# Patient Record
Sex: Male | Born: 1990 | Race: White | Hispanic: No | Marital: Single | Smoking: Never smoker
Health system: Southern US, Community
[De-identification: ages and names within clinical notes are randomized; demographics above are authoritative.]

## PROBLEM LIST (undated history)

## (undated) DIAGNOSIS — N200 Calculus of kidney: Secondary | ICD-10-CM

## (undated) HISTORY — PX: TONSILLECTOMY: SUR1361

## (undated) HISTORY — PX: HAND SURGERY: SHX662

---

## 2014-10-19 ENCOUNTER — Emergency Department
Admit: 2014-10-19 | Disposition: A | Discharge status: Home / Self Care | Payer: Self-pay | Admitting: Emergency Medicine

## 2014-11-24 ENCOUNTER — Encounter: Payer: Self-pay | Admitting: Emergency Medicine

## 2014-11-24 ENCOUNTER — Emergency Department
Admission: EM | Admit: 2014-11-24 | Discharge: 2014-11-24 | Disposition: A | Discharge status: Home / Self Care | Payer: Self-pay | Attending: Emergency Medicine | Admitting: Emergency Medicine

## 2014-11-24 DIAGNOSIS — M5431 Sciatica, right side: Secondary | ICD-10-CM

## 2014-11-24 MED ORDER — ONDANSETRON 4 MG PO TBDP
ORAL_TABLET | ORAL | Status: AC
  Filled 2014-11-24: qty 1

## 2014-11-24 MED ORDER — TRAMADOL HCL 50 MG PO TABS: 50.0000 mg | ORAL_TABLET | Freq: Four times a day (QID) | ORAL | Status: DC | PRN

## 2014-11-24 MED ORDER — DEXAMETHASONE SODIUM PHOSPHATE 10 MG/ML IJ SOLN
10.0000 mg | Freq: Once | INTRAMUSCULAR | Status: AC
  Administered 2014-11-24: 10 mg via INTRAMUSCULAR

## 2014-11-24 MED ORDER — KETOROLAC TROMETHAMINE 10 MG PO TABS: 10.0000 mg | ORAL_TABLET | Freq: Four times a day (QID) | ORAL | Status: DC | PRN

## 2014-11-24 MED ORDER — KETOROLAC TROMETHAMINE 60 MG/2ML IM SOLN: 60.0000 mg | Freq: Once | INTRAMUSCULAR | Status: DC

## 2014-11-24 MED ORDER — ONDANSETRON 4 MG PO TBDP
4.0000 mg | ORAL_TABLET | Freq: Once | ORAL | Status: AC
  Administered 2014-11-24: 4 mg via ORAL

## 2014-11-24 MED ORDER — DEXAMETHASONE SODIUM PHOSPHATE 10 MG/ML IJ SOLN
INTRAMUSCULAR | Status: AC
  Filled 2014-11-24: qty 1

## 2014-11-24 MED ORDER — KETOROLAC TROMETHAMINE 60 MG/2ML IM SOLN
INTRAMUSCULAR | Status: AC
  Filled 2014-11-24: qty 2

## 2014-11-24 MED ORDER — CYCLOBENZAPRINE HCL 10 MG PO TABS: 10.0000 mg | ORAL_TABLET | Freq: Three times a day (TID) | ORAL | Status: DC | PRN

## 2014-11-24 NOTE — ED Notes (Signed)
Pt states low back pain for "weeks". Pt states "sometimes it hurts so bad i feel like i am going to throw up." pt states has "coming and going" nausea. Pt states has pain to posterior right knee from low back. Pt denies known fever, hematuria, chills. Cms intact in all extremities per pt.

## 2014-11-24 NOTE — ED Provider Notes (Signed)
[facility] Emergency Department Provider Note  ____________________________________________  Time seen: Approximately 10:16 PM  I have reviewed the triage vital signs and the nursing notes.   HISTORY  Chief Complaint Back Pain    HPI Zachary CordsZachary A Conrad is a 24 y.o. male who presents with right-sided back pain that radiates into the right hip. He states that this is intermittent issue. He states that he is unable to follow-up outpatient with orthopedics or back specialist. He reports he is waiting on his [employer] benefits and will follow-up when possible. He reports that pain is sharp and shooting in nature and is relieved if he takes the prednisone and tramadol as prescribed. However, he forgets to take the prednisone and the pain returns.   No past medical history on file.  There are no active problems to display for this patient.   Past Surgical History  Procedure Laterality Date  . Tonsillectomy      Current Outpatient Rx  Name  Route  Sig  Dispense  Refill  . cyclobenzaprine (FLEXERIL) 10 MG tablet   Oral   Take 1 tablet (10 mg total) by mouth every 8 (eight) hours as needed for muscle spasms.   30 tablet   0   . ketorolac (TORADOL) 10 MG tablet   Oral   Take 1 tablet (10 mg total) by mouth every 6 (six) hours as needed.   20 tablet   0   . traMADol (ULTRAM) 50 MG tablet   Oral   Take 1 tablet (50 mg total) by mouth every 6 (six) hours as needed.   20 tablet   0     Allergies Keflex  History reviewed. No pertinent family history.  Social History History  Substance Use Topics  . Smoking status: Never Smoker   . Smokeless tobacco: Not on file  . Alcohol Use: Yes    Review of Systems Constitutional: No recent illness. No recent injury. Eyes: No visual changes. ENT: No sore throat. Cardiovascular: Denies chest pain or palpitations. Respiratory: Denies shortness of breath. Gastrointestinal: No abdominal pain.  Genitourinary:  Negative for dysuria. Musculoskeletal: Pain in right lower back with radiation into the right hip. Skin: Negative for rash. Neurological: Negative for headaches, focal weakness or numbness. 10-point ROS otherwise negative.  ____________________________________________   PHYSICAL EXAM:  VITAL SIGNS: ED Triage Vitals  Enc Vitals Group     BP 11/24/14 1922 146/87 mmHg     Pulse Rate 11/24/14 1922 79     Resp 11/24/14 1922 14     Temp 11/24/14 1922 98.2 F (36.8 C)     Temp Source 11/24/14 1922 Oral     SpO2 11/24/14 1922 100 %     Weight 11/24/14 1922 179 lb (81.194 kg)     Height 11/24/14 1922 5\' 9"  (1.753 m)     Head Cir --      Peak Flow --      Pain Score 11/24/14 1922 8     Pain Loc --      Pain Edu? --      Excl. in GC? --     Constitutional: Alert and oriented. Well appearing and in no acute distress. Eyes: Conjunctivae are normal. EOMI. Head: Atraumatic. Nose: No congestion/rhinnorhea. Neck: No stridor.  Respiratory: Normal respiratory effort.   Musculoskeletal: Positive straight leg raise at 30 on the right side. Neurologic:  Normal speech and language. No gross focal neurologic deficits are appreciated. Speech is normal. No gait instability. Skin:  Skin is warm, dry and intact. Atraumatic. Psychiatric: Mood and affect are normal. Speech and behavior are normal.  ____________________________________________   LABS (all labs ordered are listed, but only abnormal results are displayed)  Labs Reviewed - No data to display ____________________________________________  RADIOLOGY  Not indicated ____________________________________________   PROCEDURES  Procedure(s) performed: None   ____________________________________________   INITIAL IMPRESSION / ASSESSMENT AND PLAN / ED COURSE  Pertinent labs & imaging results that were available during my care of the patient were reviewed by me and considered in my medical decision making (see chart for  details).  I am Decadron and IM Toradol was given in the emergency department. Patient was advised to take 40 mg of prednisone pack previously prescribed tomorrow night and taper the dose until finished. He was advised to follow-up with the orthopedic or back surgeon as soon as possible. Return precautions given. ____________________________________________   FINAL CLINICAL IMPRESSION(S) / ED DIAGNOSES  Final diagnoses:  Sciatica of right side      Chinita PesterCari B Georga Stys, FNP 11/24/14 2310  Myrna Blazeravid Matthew Schaevitz, MD 11/25/14 [phone]

## 2015-03-13 ENCOUNTER — Emergency Department
Admission: EM | Admit: 2015-03-13 | Discharge: 2015-03-13 | Disposition: A | Discharge status: Home / Self Care | Payer: Self-pay | Attending: Emergency Medicine | Admitting: Emergency Medicine

## 2015-03-13 ENCOUNTER — Encounter: Payer: Self-pay | Admitting: Emergency Medicine

## 2015-03-13 DIAGNOSIS — R110 Nausea: Secondary | ICD-10-CM | POA: Insufficient documentation

## 2015-03-13 DIAGNOSIS — R109 Unspecified abdominal pain: Secondary | ICD-10-CM | POA: Insufficient documentation

## 2015-03-13 LAB — URINALYSIS COMPLETE WITH MICROSCOPIC (ARMC ONLY)
BACTERIA UA: NONE SEEN
Bilirubin Urine: NEGATIVE
Glucose, UA: NEGATIVE mg/dL
HGB URINE DIPSTICK: NEGATIVE
Ketones, ur: NEGATIVE mg/dL
Nitrite: NEGATIVE
PH: 5 (ref 5.0–8.0)
Protein, ur: NEGATIVE mg/dL
Specific Gravity, Urine: 1.023 (ref 1.005–1.030)

## 2015-03-13 LAB — BASIC METABOLIC PANEL
ANION GAP: 9 (ref 5–15)
BUN: 13 mg/dL (ref 6–20)
CHLORIDE: 99 mmol/L — AB (ref 101–111)
CO2: 25 mmol/L (ref 22–32)
Calcium: 9.3 mg/dL (ref 8.9–10.3)
Creatinine, Ser: 1.16 mg/dL (ref 0.61–1.24)
GFR calc Af Amer: >60 mL/min (ref >60)
GFR calc non Af Amer: >60 mL/min (ref >60)
GLUCOSE: 123 mg/dL — AB (ref 65–99)
POTASSIUM: 3.4 mmol/L — AB (ref 3.5–5.1)
Sodium: 133 mmol/L — ABNORMAL LOW (ref 135–145)

## 2015-03-13 LAB — CBC
HCT: 45.1 % (ref 40.0–52.0)
HEMOGLOBIN: 15.6 g/dL (ref 13.0–18.0)
MCH: 29.6 pg (ref 26.0–34.0)
MCHC: 34.6 g/dL (ref 32.0–36.0)
MCV: 85.6 fL (ref 80.0–100.0)
Platelets: 197 10*3/uL (ref 150–440)
RBC: 5.27 MIL/uL (ref 4.40–5.90)
RDW: 13.4 % (ref 11.5–14.5)
WBC: 8.5 10*3/uL (ref 3.8–10.6)

## 2015-03-13 MED ORDER — NITROFURANTOIN MONOHYD MACRO 100 MG PO CAPS: 100.0000 mg | ORAL_CAPSULE | Freq: Two times a day (BID) | ORAL | Status: AC

## 2015-03-13 MED ORDER — TRAMADOL HCL 50 MG PO TABS
50.0000 mg | ORAL_TABLET | Freq: Four times a day (QID) | ORAL | Status: DC | PRN
Start: 2015-03-13 — End: 2015-06-17

## 2015-03-13 NOTE — Discharge Instructions (Signed)
Flank Pain °Flank pain refers to pain that is located on the side of the body between the upper abdomen and the back. The pain may occur over a short period of time (acute) or may be long-term or reoccurring (chronic). It may be mild or severe. Flank pain can be caused by many things. °CAUSES  °Some of the more common causes of flank pain include: °· Muscle strains.   °· Muscle spasms.   °· A disease of your spine (vertebral disk disease).   °· A lung infection (pneumonia).   °· Fluid around your lungs (pulmonary edema).   °· A kidney infection.   °· Kidney stones.   °· A very painful skin rash caused by the chickenpox virus (shingles).   °· Gallbladder disease.   °HOME CARE INSTRUCTIONS  °Home care will depend on the cause of your pain. In general, °· Rest as directed by your caregiver. °· Drink enough fluids to keep your urine clear or pale yellow. °· Only take over-the-counter or prescription medicines as directed by your caregiver. Some medicines may help relieve the pain. °· Tell your caregiver about any changes in your pain. °· Follow up with your caregiver as directed. °SEEK IMMEDIATE MEDICAL CARE IF:  °· Your pain is not controlled with medicine.   °· You have new or worsening symptoms. °· Your pain increases.   °· You have abdominal pain.   °· You have shortness of breath.   °· You have persistent nausea or vomiting.   °· You have swelling in your abdomen.   °· You feel faint or pass out.   °· You have blood in your urine. °· You have a fever or persistent symptoms for more than 2-3 days. °· You have a fever and your symptoms suddenly get worse. °MAKE SURE YOU:  °· Understand these instructions. °· Will watch your condition. °· Will get help right away if you are not doing well or get worse. °Document Released: 08/14/2005 Document Revised: 03/17/2012 Document Reviewed: 02/05/2012 °ExitCare® Patient Information ©2015 [employer]. This information is not intended to replace advice given to you by your  health care provider. Make sure you discuss any questions you have with your health care provider. ° °

## 2015-03-13 NOTE — ED Notes (Signed)
Recently dx with kidney stone, still having right flank pain.  Skin w/d

## 2015-03-13 NOTE — ED Provider Notes (Signed)
[facility] Emergency Department Provider Note  Time seen: 3:08 PM  I have reviewed the triage vital signs and the nursing notes.   HISTORY  Chief Complaint Flank Pain    HPI Zachary Conrad is a 24 y.o. male with no past medical history who presents the emergency department with right flank pain. According to the patient for the past one month he has been experiencing right flank pain intermittently. Patient states he was diagnosed with a kidney stone. He has been seen at [facility] 3 times for the same issue. States the pain comes in waves and intermittently, and will last days without pain but then returned sharply. Describes the pain currently as 8/10, sharp pain in the right flank. Denies nausea however states he did vomit times one last night. Denies diarrhea. Denies fever. Patient states his urine was cloudy and was recently placed on antibiotics for urinary tract infection.     History reviewed. No pertinent past medical history.  There are no active problems to display for this patient.   Past Surgical History  Procedure Laterality Date  . Tonsillectomy      Current Outpatient Rx  Name  Route  Sig  Dispense  Refill  . cyclobenzaprine (FLEXERIL) 10 MG tablet   Oral   Take 1 tablet (10 mg total) by mouth every 8 (eight) hours as needed for muscle spasms.   30 tablet   0   . ketorolac (TORADOL) 10 MG tablet   Oral   Take 1 tablet (10 mg total) by mouth every 6 (six) hours as needed.   20 tablet   0   . traMADol (ULTRAM) 50 MG tablet   Oral   Take 1 tablet (50 mg total) by mouth every 6 (six) hours as needed.   20 tablet   0     Allergies Keflex  History reviewed. No pertinent family history.  Social History Social History  Substance Use Topics  . Smoking status: Never Smoker   . Smokeless tobacco: None  . Alcohol Use: Yes    Review of Systems Constitutional: Negative for fever. Cardiovascular: Negative for chest  pain. Respiratory: Negative for shortness of breath. Gastrointestinal: Positive for right flank pain and nausea. Genitourinary: States cloudy urine at times. Musculoskeletal: Positive for right flank pain Neurological: Negative for headache 10-point ROS otherwise negative.  ____________________________________________   PHYSICAL EXAM:  VITAL SIGNS: ED Triage Vitals  Enc Vitals Group     BP 03/13/15 1409 142/89 mmHg     Pulse Rate 03/13/15 1409 92     Resp 03/13/15 1409 20     Temp 03/13/15 1409 98 F (36.7 C)     Temp Source 03/13/15 1409 Oral     SpO2 03/13/15 1409 100 %     Weight 03/13/15 1409 184 lb (83.462 kg)     Height 03/13/15 1409  (1.753 m)     Head Cir --      Peak Flow --      Pain Score 03/13/15 1346 8     Pain Loc --      Pain Edu? --      Excl. in GC? --     Constitutional: Alert and oriented. Well appearing and in no distress. Eyes: Normal exam ENT   Mouth/Throat: Mucous membranes are moist. Cardiovascular: Normal rate, regular rhythm. No murmur Respiratory: Normal respiratory effort without tachypnea nor retractions. Breath sounds are clear and equal bilaterally. No wheezes/rales/rhonchi. Gastrointestinal: Soft, nontender, no reaction to  palpation. No distention. No CVA tenderness. No right upper quadrant tenderness. Musculoskeletal: Nontender with normal range of motion in all extremities. Neurologic:  Normal speech and language. No gross focal neurologic deficits  Skin:  Skin is warm, dry and intact.  Psychiatric: Mood and affect are normal. Speech and behavior are normal.   ____________________________________________    INITIAL IMPRESSION / ASSESSMENT AND PLAN / ED COURSE  Pertinent labs & imaging results that were available during my care of the patient were reviewed by me and considered in my medical decision making (see chart for details).  Patient with continued intermittent right flank pain. Patient has been seen at [facility] for this  issue 3 times. He had a negative CT abdomen and pelvis on 7/27, and again on 8/6. I do not believe the patient would benefit from additional CT imaging given normal lab work today. Patient's urinalysis does show 6-30 white blood cells in his urine, given his right flank pain we will treat with an antibiotics, I will also send a culture to confirm infection. I discussed with the patient the need to follow up with her primary care doctor, patient is agreeable. We'll discharge the patient home in a short course of Ultram and have the patient follow-up with her primary care physician.  ____________________________________________   FINAL CLINICAL IMPRESSION(S) / ED DIAGNOSES  Right flank pain   Minna Antis, MD 03/13/15 1511

## 2015-05-09 ENCOUNTER — Encounter: Payer: Self-pay | Admitting: *Deleted

## 2015-05-09 ENCOUNTER — Emergency Department
Admission: EM | Admit: 2015-05-09 | Discharge: 2015-05-09 | Disposition: A | Discharge status: Home / Self Care | Payer: Self-pay | Attending: Emergency Medicine | Admitting: Emergency Medicine

## 2015-05-09 ENCOUNTER — Emergency Department: Payer: Self-pay

## 2015-05-09 DIAGNOSIS — S60221A Contusion of right hand, initial encounter: Secondary | ICD-10-CM | POA: Insufficient documentation

## 2015-05-09 DIAGNOSIS — Y9389 Activity, other specified: Secondary | ICD-10-CM | POA: Insufficient documentation

## 2015-05-09 DIAGNOSIS — Y998 Other external cause status: Secondary | ICD-10-CM | POA: Insufficient documentation

## 2015-05-09 DIAGNOSIS — Y9289 Other specified places as the place of occurrence of the external cause: Secondary | ICD-10-CM | POA: Insufficient documentation

## 2015-05-09 DIAGNOSIS — W230XXA Caught, crushed, jammed, or pinched between moving objects, initial encounter: Secondary | ICD-10-CM | POA: Insufficient documentation

## 2015-05-09 MED ORDER — HYDROCODONE-ACETAMINOPHEN 5-325 MG PO TABS: 1.0000 | ORAL_TABLET | ORAL | Status: DC | PRN

## 2015-05-09 MED ORDER — IBUPROFEN 800 MG PO TABS: 800.0000 mg | ORAL_TABLET | Freq: Three times a day (TID) | ORAL | Status: DC | PRN

## 2015-05-09 NOTE — Discharge Instructions (Signed)
Hand Contusion  A hand contusion is a deep bruise on your hand area. Contusions are the result of an injury that caused bleeding under the skin. The contusion may turn blue, purple, or yellow. Minor injuries will give you a painless contusion, but more severe contusions may stay painful and swollen for a few weeks.  CAUSES   A contusion is usually caused by a blow, trauma, or direct force to an area of the body.  SYMPTOMS    Swelling and redness of the injured area.   Discoloration of the injured area.   Tenderness and soreness of the injured area.   Pain.  DIAGNOSIS   The diagnosis can be made by taking a history and performing a physical exam. An X-ray, CT scan, or MRI may be needed to determine if there were any associated injuries, such as broken bones (fractures).  TREATMENT   Often, the best treatment for a hand contusion is resting, elevating, icing, and applying cold compresses to the injured area. Over-the-counter medicines may also be recommended for pain control.  HOME CARE INSTRUCTIONS    Put ice on the injured area.    Put ice in a plastic bag.    Place a towel between your skin and the bag.    Leave the ice on for 15-20 minutes, 03-04 times a day.   Only take over-the-counter or prescription medicines as directed by your caregiver. Your caregiver may recommend avoiding anti-inflammatory medicines (aspirin, ibuprofen, and naproxen) for 48 hours because these medicines may increase bruising.   If told, use an elastic wrap as directed. This can help reduce swelling. You may remove the wrap for sleeping, showering, and bathing. If your fingers become numb, cold, or blue, take the wrap off and reapply it more loosely.   Elevate your hand with pillows to reduce swelling.   Avoid overusing your hand if it is painful.  SEEK IMMEDIATE MEDICAL CARE IF:    You have increased redness, swelling, or pain in your hand.   Your swelling or pain is not relieved with medicines.   You have loss of feeling in  your hand or are unable to move your fingers.   Your hand turns cold or blue.   You have pain when you move your fingers.   Your hand becomes warm to the touch.   Your contusion does not improve in 2 days.  MAKE SURE YOU:    Understand these instructions.   Will watch your condition.   Will get help right away if you are not doing well or get worse.     This information is not intended to replace advice given to you by your health care provider. Make sure you discuss any questions you have with your health care provider.     Document Released: 12/13/2001 Document Revised: 03/17/2012 Document Reviewed: 12/15/2011  Elsevier Interactive Patient Education 2016 [employer]

## 2015-05-09 NOTE — ED Provider Notes (Signed)
[facility] Emergency Department Provider Note  ____________________________________________  Time seen: Approximately 5:32 PM  I have reviewed the triage vital signs and the nursing notes.   HISTORY  Chief Complaint Hand Pain   HPI Zachary Conrad is a 24 y.o. male who presents to the emergency department for evaluation of right hand pain. He states that he was attempting to prevent his dog from jumping out of the back seat as the door of his vehicle was closing and his right hand was caught in the door. The incident occurred a few hours ago. He is complaining of pain and swelling to the right hand.   History reviewed. No pertinent past medical history.  There are no active problems to display for this patient.   Past Surgical History  Procedure Laterality Date  . Tonsillectomy      Current Outpatient Rx  Name  Route  Sig  Dispense  Refill  . cyclobenzaprine (FLEXERIL) 10 MG tablet   Oral   Take 1 tablet (10 mg total) by mouth every 8 (eight) hours as needed for muscle spasms.   30 tablet   0   . HYDROcodone-acetaminophen (NORCO/VICODIN) 5-325 MG tablet   Oral   Take 1 tablet by mouth every 4 (four) hours as needed for moderate pain.   12 tablet   0   . ibuprofen (ADVIL,MOTRIN) 800 MG tablet   Oral   Take 1 tablet (800 mg total) by mouth every 8 (eight) hours as needed.   30 tablet   0   . ketorolac (TORADOL) 10 MG tablet   Oral   Take 1 tablet (10 mg total) by mouth every 6 (six) hours as needed.   20 tablet   0   . traMADol (ULTRAM) 50 MG tablet   Oral   Take 1 tablet (50 mg total) by mouth every 6 (six) hours as needed.   15 tablet   0     Allergies Keflex  No family history on file.  Social History Social History  Substance Use Topics  . Smoking status: Never Smoker   . Smokeless tobacco: None  . Alcohol Use: Yes    Review of Systems Constitutional: No recent illness. Eyes: No visual changes. ENT: No sore  throat. Cardiovascular: Denies chest pain or palpitations. Respiratory: Denies shortness of breath. Gastrointestinal: No abdominal pain.  Genitourinary: Negative for dysuria. Musculoskeletal: Pain in right hand. Skin: Negative for rash. Neurological: Negative for headaches, focal weakness or numbness. 10-point ROS otherwise negative.  ____________________________________________   PHYSICAL EXAM:  VITAL SIGNS: ED Triage Vitals  Enc Vitals Group     BP 05/09/15 1634 153/78 mmHg     Pulse Rate 05/09/15 1634 100     Resp 05/09/15 1634 20     Temp 05/09/15 1634 98.7 F (37.1 C)     Temp Source 05/09/15 1634 Oral     SpO2 05/09/15 1634 100 %     Weight 05/09/15 1634 195 lb (88.451 kg)     Height 05/09/15 1634 5\' 10"  (1.778 m)     Head Cir --      Peak Flow --      Pain Score 05/09/15 1635 10     Pain Loc --      Pain Edu? --      Excl. in GC? --     Constitutional: Alert and oriented. Well appearing and in no acute distress. Eyes: Conjunctivae are normal. EOMI. Head: Atraumatic. Nose: No congestion/rhinnorhea. Neck: No  stridor.  Respiratory: Normal respiratory effort.   Musculoskeletal: Pain and swelling noted to the MCP and mid right hand over the third fourth and fifth metacarpals. Neurologic:  Normal speech and language. No gross focal neurologic deficits are appreciated. Speech is normal. No gait instability. Skin:  Skin is warm, dry and intact. Atraumatic. Psychiatric: Mood and affect are normal. Speech and behavior are normal.  ____________________________________________   LABS (all labs ordered are listed, but only abnormal results are displayed)  Labs Reviewed - No data to display ____________________________________________  RADIOLOGY  Right hand film negative for acute bony abnormality.  I, Kem Boroughs, personally viewed and evaluated these images (plain radiographs) as part of my medical decision making.    ____________________________________________   PROCEDURES  Procedure(s) performed: None   ____________________________________________   INITIAL IMPRESSION / ASSESSMENT AND PLAN / ED COURSE  Pertinent labs & imaging results that were available during my care of the patient were reviewed by me and considered in my medical decision making (see chart for details).  Patient was advised to follow-up with orthopedics for symptoms that are not improving over the next 7 days. He is given prescriptions for Norco and ibuprofen to be taken as needed for pain. ER return precautions were given. ____________________________________________   FINAL CLINICAL IMPRESSION(S) / ED DIAGNOSES  Final diagnoses:  Hand contusion, right, initial encounter       Chinita Pester, FNP 05/09/15 1841  Myrna Blazer, MD 05/09/15 [phone]

## 2015-05-09 NOTE — ED Notes (Signed)
Pt slammed right hand in car door, hand is swollen

## 2015-06-16 DIAGNOSIS — K0889 Other specified disorders of teeth and supporting structures: Secondary | ICD-10-CM | POA: Insufficient documentation

## 2015-06-16 NOTE — ED Notes (Signed)
Pt reports having right upper wisdom tooth extracted 2 days ago. Pt c/o bleeding and pain to site.

## 2015-06-17 ENCOUNTER — Emergency Department
Admission: EM | Admit: 2015-06-17 | Discharge: 2015-06-17 | Disposition: A | Discharge status: Home / Self Care | Payer: Self-pay | Attending: Emergency Medicine | Admitting: Emergency Medicine

## 2015-06-17 DIAGNOSIS — K0889 Other specified disorders of teeth and supporting structures: Secondary | ICD-10-CM

## 2015-06-17 MED ORDER — HYDROCODONE-ACETAMINOPHEN 5-325 MG PO TABS: 1.0000 | ORAL_TABLET | Freq: Four times a day (QID) | ORAL | Status: DC | PRN

## 2015-06-17 MED ORDER — HYDROCODONE-ACETAMINOPHEN 5-325 MG PO TABS
2.0000 | ORAL_TABLET | Freq: Once | ORAL | Status: AC
Start: 2015-06-17 — End: 2015-06-17
  Administered 2015-06-17: 2 via ORAL
  Filled 2015-06-17: qty 2

## 2015-06-17 NOTE — ED Notes (Signed)
Pt discharged, will sign for discharge instructions at 0530.

## 2015-06-17 NOTE — Discharge Instructions (Signed)
You were evaluated for pain after tooth extraction. Exam and evaluation are reassuring for no evidence of acute complication, and I suspect postoperative pain.  Follow up with your dentist on Monday or Tuesday for reevaluation.  Return to the emergency room for any worsening condition including worsening pain, confusion or altered mental status, fever, face swelling or skin rash.

## 2015-06-17 NOTE — ED Notes (Signed)
Pt informed per charge rn and nursing supervisor, pt not able to drive after narcotic administration for 4 hours. Pt declines offer for taxi, declines offer to have someone pick him up. Pt provided with ice, ice cream, tissues, drink and tv remote. Lights dimmed. Pt verbalizes understanding of driving restrictions.

## 2015-06-17 NOTE — ED Notes (Signed)
Pt sleeping. 

## 2015-06-17 NOTE — ED Notes (Signed)
Dr. Shaune PollackLord in to see pt.

## 2015-06-17 NOTE — ED Provider Notes (Signed)
[facility] Emergency Department Provider Note   ____________________________________________  Time seen:  I have reviewed the triage vital signs and the triage nursing note.  HISTORY  Chief Complaint Dental Pain   Historian Patient  HPI Zachary Conrad is a 24 y.o. male who is here for evaluation of pain since extraction of impacted right upper molar/wisdom tooth 2 days ago. He's been taking 5-10 milligrams hydrocodone every 6 hours for the past 2 days. He is down to 2 tablets left. He still having significant amount of pain. He still has caused that has a small amount of blood on it when he removes it. He is currently taking an antibiotic. No fever. Pain is moderate to severe.    No past medical history on file.  There are no active problems to display for this patient.   Past Surgical History  Procedure Laterality Date  . Tonsillectomy      Current Outpatient Rx  Name  Route  Sig  Dispense  Refill  . HYDROcodone-acetaminophen (NORCO/VICODIN) 5-325 MG tablet   Oral   Take 1 tablet by mouth every 4 (four) hours as needed for moderate pain.   12 tablet   0   . HYDROcodone-acetaminophen (NORCO/VICODIN) 5-325 MG tablet   Oral   Take 1-2 tablets by mouth every 6 (six) hours as needed for moderate pain.   10 tablet   0   . ibuprofen (ADVIL,MOTRIN) 800 MG tablet   Oral   Take 1 tablet (800 mg total) by mouth every 8 (eight) hours as needed.   30 tablet   0     Allergies Labetalol and Keflex  No family history on file.  Social History Social History  Substance Use Topics  . Smoking status: Never Smoker   . Smokeless tobacco: Not on file  . Alcohol Use: Yes    Review of Systems  Constitutional: Negative for fever. Eyes: Negative for visual changes. ENT: Negative for sore throat. Cardiovascular: Negative for chest pain. Respiratory: Negative for shortness of breath. Gastrointestinal: Negative for abdominal pain, vomiting and  diarrhea. Genitourinary: Negative for dysuria. Musculoskeletal: Negative for back pain. Skin: Negative for rash. Neurological: Negative for headache. 10 point Review of Systems otherwise negative ____________________________________________   PHYSICAL EXAM:  VITAL SIGNS: ED Triage Vitals  Enc Vitals Group     BP 06/16/15 2332 140/80 mmHg     Pulse Rate 06/16/15 2332 69     Resp 06/16/15 2332 18     Temp 06/16/15 2332 98.6 F (37 C)     Temp Source 06/16/15 2332 Oral     SpO2 06/16/15 2332 98 %     Weight 06/16/15 2332 195 lb (88.451 kg)     Height 06/16/15 2332  (1.778 m)     Head Cir --      Peak Flow --      Pain Score 06/16/15 2332 8     Pain Loc --      Pain Edu? --      Excl. in GC? --      Constitutional: Alert and oriented. Well appearing and in no distress. Eyes: Conjunctivae are normal. PERRL. Normal extraocular movements. ENT   Head: Normocephalic.  Mild swelling at the right maxilla. No skin rash.   Nose: No congestion/rhinnorhea.   Mouth/Throat: Mucous membranes are moist. Swelling of the gum at the right posterior most molar at the site of the recent surgery. Small white fleck seen at the Center of the wound,  consistent with likely stitch versus bone fragment.   Neck: No stridor. Cardiovascular/Chest: Normal rate, regular rhythm.  No murmurs, rubs, or gallops. Respiratory: Normal respiratory effort without tachypnea nor retractions. Breath sounds are clear and equal bilaterally. No wheezes/rales/rhonchi. Gastrointestinal: Soft. No distention, no guarding, no rebound. Nontender   Genitourinary/rectal:Deferred Musculoskeletal: Nontender with normal range of motion in all extremities. No joint effusions.  No lower extremity tenderness.  No edema. Neurologic:  Normal speech and language. No gross or focal neurologic deficits are appreciated. Skin:  Skin is warm, dry and intact. No rash noted. Psychiatric: Mood and affect are normal. Speech and  behavior are normal. Patient exhibits appropriate insight and judgment.  ____________________________________________   EKG I, Governor Rooksebecca Ebany Bowermaster, MD, the attending physician have personally viewed and interpreted all ECGs.   EKG performed ____________________________________________  LABS (pertinent positives/negatives)  None  ____________________________________________  RADIOLOGY All Xrays were viewed by me. Imaging interpreted by Radiologist.  None __________________________________________  PROCEDURES  Procedure(s) performed: None  Critical Care performed: None  ____________________________________________   ED COURSE / ASSESSMENT AND PLAN  CONSULTATIONS: None  Pertinent labs & imaging results that were available during my care of the patient were reviewed by me and considered in my medical decision making (see chart for details).   Patient impacted wisdom tooth removed 2 days ago and still having postoperative pain, but I feel like his course is consistent with postoperative pain rather than new/acute complication. No fevers, no skin rash, no trouble swallowing. I am going to prescribe 10 additional hydrocodone tablets. Patient instructed to follow with his dentist on Monday.  Patient / Family / Caregiver informed of clinical course, medical decision-making process, and agree with plan.   I discussed return precautions, follow-up instructions, and discharged instructions with patient and/or family.  ___________________________________________   FINAL CLINICAL IMPRESSION(S) / ED DIAGNOSES   Final diagnoses:  Pain, dental       Governor Rooksebecca Kareem Aul, MD 06/17/15 [phone]

## 2015-11-16 ENCOUNTER — Emergency Department
Admission: EM | Admit: 2015-11-16 | Discharge: 2015-11-17 | Disposition: A | Discharge status: Home / Self Care | Payer: Self-pay | Attending: Emergency Medicine | Admitting: Emergency Medicine

## 2015-11-16 ENCOUNTER — Encounter: Payer: Self-pay | Admitting: Emergency Medicine

## 2015-11-16 DIAGNOSIS — R319 Hematuria, unspecified: Secondary | ICD-10-CM | POA: Insufficient documentation

## 2015-11-16 DIAGNOSIS — A749 Chlamydial infection, unspecified: Secondary | ICD-10-CM | POA: Insufficient documentation

## 2015-11-16 DIAGNOSIS — R109 Unspecified abdominal pain: Secondary | ICD-10-CM

## 2015-11-16 HISTORY — DX: Calculus of kidney: N20.0

## 2015-11-16 LAB — CBC
HCT: 42.3 % (ref 40.0–52.0)
HEMOGLOBIN: 14.3 g/dL (ref 13.0–18.0)
MCH: 29 pg (ref 26.0–34.0)
MCHC: 33.9 g/dL (ref 32.0–36.0)
MCV: 85.5 fL (ref 80.0–100.0)
Platelets: 270 10*3/uL (ref 150–440)
RBC: 4.95 MIL/uL (ref 4.40–5.90)
RDW: 13.5 % (ref 11.5–14.5)
WBC: 9.1 10*3/uL (ref 3.8–10.6)

## 2015-11-16 LAB — BASIC METABOLIC PANEL
Anion gap: 5 (ref 5–15)
BUN: 6 mg/dL (ref 6–20)
CHLORIDE: 108 mmol/L (ref 101–111)
CO2: 29 mmol/L (ref 22–32)
Calcium: 9.5 mg/dL (ref 8.9–10.3)
Creatinine, Ser: 0.95 mg/dL (ref 0.61–1.24)
GFR calc Af Amer: >60 mL/min (ref >60)
GFR calc non Af Amer: >60 mL/min (ref >60)
Glucose, Bld: 101 mg/dL — ABNORMAL HIGH (ref 65–99)
Potassium: 3.6 mmol/L (ref 3.5–5.1)
SODIUM: 142 mmol/L (ref 135–145)

## 2015-11-16 LAB — URINALYSIS COMPLETE WITH MICROSCOPIC (ARMC ONLY)
Bilirubin Urine: NEGATIVE
Glucose, UA: NEGATIVE mg/dL
KETONES UR: NEGATIVE mg/dL
Leukocytes, UA: NEGATIVE
Nitrite: NEGATIVE
PH: 7 (ref 5.0–8.0)
Protein, ur: NEGATIVE mg/dL
Specific Gravity, Urine: 1.013 (ref 1.005–1.030)
Squamous Epithelial / LPF: NONE SEEN

## 2015-11-16 NOTE — ED Notes (Signed)
Pt states over one week of "waking up in the night crying because my kidneys hurt" pt states 'It's not 24/7 pain, but when it comes it's unbearable." pt denies known fever, denies dysuria. Pt states would also like to be checked for chlamydia. Pt appears in no acute distress in triage.

## 2015-11-16 NOTE — ED Notes (Signed)
Pt given urine cup and told that another sample was needed.  Pt given water to drink

## 2015-11-17 ENCOUNTER — Emergency Department: Payer: Self-pay

## 2015-11-17 LAB — CHLAMYDIA/NGC RT PCR (ARMC ONLY)
Chlamydia Tr: DETECTED — AB
N GONORRHOEAE: NOT DETECTED

## 2015-11-17 MED ORDER — PROMETHAZINE HCL 25 MG PO TABS: 25.0000 mg | ORAL_TABLET | Freq: Four times a day (QID) | ORAL | Status: DC | PRN

## 2015-11-17 MED ORDER — KETOROLAC TROMETHAMINE 30 MG/ML IJ SOLN
30.0000 mg | Freq: Once | INTRAMUSCULAR | Status: AC
  Administered 2015-11-17: 30 mg via INTRAMUSCULAR
  Filled 2015-11-17: qty 1

## 2015-11-17 MED ORDER — AZITHROMYCIN 500 MG PO TABS
2000.0000 mg | ORAL_TABLET | Freq: Once | ORAL | Status: AC
  Administered 2015-11-17: 2000 mg via ORAL
  Filled 2015-11-17: qty 4

## 2015-11-17 MED ORDER — METRONIDAZOLE 500 MG PO TABS: 500.0000 mg | ORAL_TABLET | Freq: Three times a day (TID) | ORAL | Status: DC

## 2015-11-17 MED ORDER — NAPROXEN 500 MG PO TABS: 500.0000 mg | ORAL_TABLET | Freq: Two times a day (BID) | ORAL | Status: DC

## 2015-11-17 MED ORDER — ONDANSETRON 8 MG PO TBDP
8.0000 mg | ORAL_TABLET | Freq: Once | ORAL | Status: AC
  Administered 2015-11-17: 8 mg via ORAL
  Filled 2015-11-17: qty 1

## 2015-11-17 MED ORDER — DOXYCYCLINE HYCLATE 100 MG PO CAPS: 100.0000 mg | ORAL_CAPSULE | Freq: Two times a day (BID) | ORAL | Status: DC

## 2015-11-17 NOTE — ED Provider Notes (Signed)
[facility] Emergency Department Provider Note  ____________________________________________  Time seen: 12:00 AM  I have reviewed the triage vital signs and the nursing notes.   HISTORY  Chief Complaint Flank Pain    HPI Zachary Conrad is a 25 y.o. male comes to the ED complaining of one week of right flank pain that is intermittent. Happens once or twice a day. No dysuria frequency urgency hematuria. He was recently treated for Chlamydia with what he thinks was doxycycline. Denies any nausea or vomiting, no chest pain shortness of breath or fevers. Pain is severe when present, feels aching and sharp. Nonradiating.     Past Medical History  Diagnosis Date  . Kidney stones      There are no active problems to display for this patient.    Past Surgical History  Procedure Laterality Date  . Tonsillectomy       Current Outpatient Rx  Name  Route  Sig  Dispense  Refill  . diphenhydrAMINE (BENADRYL) 50 MG capsule   Oral   Take 50 mg by mouth every 6 (six) hours as needed.         . doxycycline (VIBRAMYCIN) 100 MG capsule   Oral   Take 1 capsule (100 mg total) by mouth 2 (two) times daily.   28 capsule   0   . metroNIDAZOLE (FLAGYL) 500 MG tablet   Oral   Take 1 tablet (500 mg total) by mouth 3 (three) times daily.   30 tablet   0   . naproxen (NAPROSYN) 500 MG tablet   Oral   Take 1 tablet (500 mg total) by mouth 2 (two) times daily with a meal.   20 tablet   0   . promethazine (PHENERGAN) 25 MG tablet   Oral   Take 1 tablet (25 mg total) by mouth every 6 (six) hours as needed for nausea or vomiting.   15 tablet   0      Allergies Labetalol; Ciprofloxacin; and Keflex   No family history on file.  Social History Social History  Substance Use Topics  . Smoking status: Never Smoker   . Smokeless tobacco: Never Used  . Alcohol Use: Yes    Review of Systems  Constitutional:   No fever or chills.  Eyes:   No  vision changes.  ENT:   No sore throat. No rhinorrhea. Cardiovascular:   No chest pain. Respiratory:   No dyspnea or cough. Gastrointestinal:   Negative for abdominal pain, vomiting and diarrhea.  Genitourinary:   Negative for dysuria or difficulty urinating. Musculoskeletal:   Right flank pain as above Neurological:   Negative for headaches 10-point ROS otherwise negative.  ____________________________________________   PHYSICAL EXAM:  VITAL SIGNS: ED Triage Vitals  Enc Vitals Group     BP 11/16/15 2012 146/79 mmHg     Pulse Rate 11/16/15 2012 80     Resp 11/16/15 2012 14     Temp 11/16/15 2012 98.9 F (37.2 C)     Temp Source 11/16/15 2012 Oral     SpO2 11/16/15 2012 100 %     Weight 11/16/15 2012 173 lb (78.472 kg)     Height 11/16/15 2012 5\' 10"  (1.778 m)     Head Cir --      Peak Flow --      Pain Score 11/16/15 2012 5     Pain Loc --      Pain Edu? --      Excl.  in GC? --     Vital signs reviewed, nursing assessments reviewed.   Constitutional:   Alert and oriented. Well appearing and in no distress. Eyes:   No scleral icterus. No conjunctival pallor. PERRL. EOMI.  No nystagmus. ENT   Head:   Normocephalic and atraumatic.   Nose:   No congestion/rhinnorhea. No septal hematoma   Mouth/Throat:   MMM, no pharyngeal erythema. No peritonsillar mass.    Neck:   No stridor. No SubQ emphysema. No meningismus. Hematological/Lymphatic/Immunilogical:   No cervical lymphadenopathy. Cardiovascular:   RRR. Symmetric bilateral radial and DP pulses.  No murmurs.  Respiratory:   Normal respiratory effort without tachypnea nor retractions. Breath sounds are clear and equal bilaterally. No wheezes/rales/rhonchi. Gastrointestinal:   Soft With mild right lower quadrant tenderness. Non distended. There is no CVA tenderness.  No rebound, rigidity, or guarding. Genitourinary:   Normal external genitalia, no scrotal tenderness or swelling. No testicular tenderness. No  inguinal lymphadenopathy. No lesions or penile discharge. Musculoskeletal:   Nontender with normal range of motion in all extremities. No joint effusions.  No lower extremity tenderness.  No edema. Neurologic:   Normal speech and language.  CN 2-10 normal. Motor grossly intact. No gross focal neurologic deficits are appreciated.  Skin:    Skin is warm, dry and intact. No rash noted.  No petechiae, purpura, or bullae.  ____________________________________________    LABS (pertinent positives/negatives) (all labs ordered are listed, but only abnormal results are displayed) Labs Reviewed  CHLAMYDIA/NGC RT PCR ([facility] ONLY) - Abnormal; Notable for the following:    Chlamydia Tr DETECTED (*)    All other components within normal limits  URINALYSIS COMPLETEWITH MICROSCOPIC ([facility] ONLY) - Abnormal; Notable for the following:    Color, Urine YELLOW (*)    APPearance CLEAR (*)    Hgb urine dipstick 3+ (*)    Bacteria, UA RARE (*)    All other components within normal limits  BASIC METABOLIC PANEL - Abnormal; Notable for the following:    Glucose, Bld 101 (*)    All other components within normal limits  CBC   ____________________________________________   EKG    ____________________________________________    RADIOLOGY  CT abdomen and pelvis negative  ____________________________________________   PROCEDURES   ____________________________________________   INITIAL IMPRESSION / ASSESSMENT AND PLAN / ED COURSE  Pertinent labs & imaging results that were available during my care of the patient were reviewed by me and considered in my medical decision making (see chart for details).  Patient well appearing no acute distress. Presents with flank pain and found to have hematuria as well. Labs unremarkable otherwise. Repeat Chlamydia testing is positive, we'll give 2 g azithromycin, repeat course of doxycycline as well as Flagyl. Plan for discharge home and follow-up  with primary care.Considering the patient's symptoms, medical history, and physical examination today, I have low suspicion for cholecystitis or biliary pathology, pancreatitis, perforation or bowel obstruction, hernia, intra-abdominal abscess, AAA or dissection, volvulus or intussusception, mesenteric ischemia, or appendicitis.       ____________________________________________   FINAL CLINICAL IMPRESSION(S) / ED DIAGNOSES  Final diagnoses:  Hematuria  Right flank pain  Chlamydia infection     Portions of this note were generated with dragon dictation software. Dictation errors may occur despite best attempts at proofreading.   Sharman Cheek, MD 11/17/15 [phone]

## 2015-11-17 NOTE — Discharge Instructions (Signed)
Abdominal Pain, Adult °Many things can cause abdominal pain. Usually, abdominal pain is not caused by a disease and will improve without treatment. It can often be observed and treated at home. Your health care provider will do a physical exam and possibly order blood tests and X-rays to help determine the seriousness of your pain. However, in many cases, more time must pass before a clear cause of the pain can be found. Before that point, your health care provider may not know if you need more testing or further treatment. °HOME CARE INSTRUCTIONS °Monitor your abdominal pain for any changes. The following actions may help to alleviate any discomfort you are experiencing: °· Only take over-the-counter or prescription medicines as directed by your health care provider. °· Do not take laxatives unless directed to do so by your health care provider. °· Try a clear liquid diet (broth, tea, or water) as directed by your health care provider. Slowly move to a bland diet as tolerated. °SEEK MEDICAL CARE IF: °· You have unexplained abdominal pain. °· You have abdominal pain associated with nausea or diarrhea. °· You have pain when you urinate or have a bowel movement. °· You experience abdominal pain that wakes you in the night. °· You have abdominal pain that is worsened or improved by eating food. °· You have abdominal pain that is worsened with eating fatty foods. °· You have a fever. °SEEK IMMEDIATE MEDICAL CARE IF: °· Your pain does not go away within 2 hours. °· You keep throwing up (vomiting). °· Your pain is felt only in portions of the abdomen, such as the right side or the left lower portion of the abdomen. °· You pass bloody or black tarry stools. °MAKE SURE YOU: °· Understand these instructions. °· Will watch your condition. °· Will get help right away if you are not doing well or get worse. °  °This information is not intended to replace advice given to you by your health care provider. Make sure you discuss  any questions you have with your health care provider. °  °Document Released: 04/02/2005 Document Revised: 03/14/2015 Document Reviewed: 03/02/2013 °Elsevier Interactive Patient Education ©2016 [employer] ° °Flank Pain °Flank pain refers to pain that is located on the side of the body between the upper abdomen and the back. The pain may occur over a short period of time (acute) or may be long-term or reoccurring (chronic). It may be mild or severe. Flank pain can be caused by many things. °CAUSES  °Some of the more common causes of flank pain include: °· Muscle strains.   °· Muscle spasms.   °· A disease of your spine (vertebral disk disease).   °· A lung infection (pneumonia).   °· Fluid around your lungs (pulmonary edema).   °· A kidney infection.   °· Kidney stones.   °· A very painful skin rash caused by the chickenpox virus (shingles).   °· Gallbladder disease.   °HOME CARE INSTRUCTIONS  °Home care will depend on the cause of your pain. In general, °· Rest as directed by your caregiver. °· Drink enough fluids to keep your urine clear or pale yellow. °· Only take over-the-counter or prescription medicines as directed by your caregiver. Some medicines may help relieve the pain. °· Tell your caregiver about any changes in your pain. °· Follow up with your caregiver as directed. °SEEK IMMEDIATE MEDICAL CARE IF:  °· Your pain is not controlled with medicine.   °· You have new or worsening symptoms. °· Your pain increases.   °· You have abdominal   pain.   You have shortness of breath.   You have persistent nausea or vomiting.   You have swelling in your abdomen.   You feel faint or pass out.   You have blood in your urine.  You have a fever or persistent symptoms for more than 2-3 days.  You have a fever and your symptoms suddenly get worse. MAKE SURE YOU:   Understand these instructions.  Will watch your condition.  Will get help right away if you are not doing well or get worse.   This  information is not intended to replace advice given to you by your health care provider. Make sure you discuss any questions you have with your health care provider.   Document Released: 08/14/2005 Document Revised: 03/17/2012 Document Reviewed: 02/05/2012 Elsevier Interactive Patient Education 2016 [employer]  Hematuria, Adult Hematuria is blood in your urine. It can be caused by a bladder infection, kidney infection, prostate infection, kidney stone, or cancer of your urinary tract. Infections can usually be treated with medicine, and a kidney stone usually will pass through your urine. If neither of these is the cause of your hematuria, further workup to find out the reason may be needed. It is very important that you tell your health care provider about any blood you see in your urine, even if the blood stops without treatment or happens without causing pain. Blood in your urine that happens and then stops and then happens again can be a symptom of a very serious condition. Also, pain is not a symptom in the initial stages of many urinary cancers. HOME CARE INSTRUCTIONS   Drink lots of fluid, 3-4 quarts a day. If you have been diagnosed with an infection, cranberry juice is especially recommended, in addition to large amounts of water.  Avoid caffeine, tea, and carbonated beverages because they tend to irritate the bladder.  Avoid alcohol because it may irritate the prostate.  Take all medicines as directed by your health care provider.  If you were prescribed an antibiotic medicine, finish it all even if you start to feel better.  If you have been diagnosed with a kidney stone, follow your health care provider's instructions regarding straining your urine to catch the stone.  Empty your bladder often. Avoid holding urine for long periods of time.  After a bowel movement, women should cleanse front to back. Use each tissue only once.  Empty your bladder before and after sexual  intercourse if you are a male. SEEK MEDICAL CARE IF:  You develop back pain.  You have a fever.  You have a feeling of sickness in your stomach (nausea) or vomiting.  Your symptoms are not better in 3 days. Return sooner if you are getting worse. SEEK IMMEDIATE MEDICAL CARE IF:   You develop severe vomiting and are unable to keep the medicine down.  You develop severe back or abdominal pain despite taking your medicines.  You begin passing a large amount of blood or clots in your urine.  You feel extremely weak or faint, or you pass out. MAKE SURE YOU:   Understand these instructions.  Will watch your condition.  Will get help right away if you are not doing well or get worse.   This information is not intended to replace advice given to you by your health care provider. Make sure you discuss any questions you have with your health care provider.   Document Released: 06/23/2005 Document Revised: 07/14/2014 Document Reviewed: 02/21/2013 Elsevier Interactive Patient  Education ©2016 [employer] ° °

## 2015-11-17 NOTE — ED Notes (Signed)
Patient transported to CT 

## 2016-05-15 ENCOUNTER — Emergency Department: Payer: Self-pay

## 2016-05-15 ENCOUNTER — Emergency Department
Admission: EM | Admit: 2016-05-15 | Discharge: 2016-05-15 | Disposition: A | Discharge status: Home / Self Care | Payer: Self-pay | Attending: Student in an Organized Health Care Education/Training Program | Admitting: Student in an Organized Health Care Education/Training Program

## 2016-05-15 ENCOUNTER — Encounter: Payer: Self-pay | Admitting: Emergency Medicine

## 2016-05-15 DIAGNOSIS — Z79899 Other long term (current) drug therapy: Secondary | ICD-10-CM | POA: Insufficient documentation

## 2016-05-15 DIAGNOSIS — Z791 Long term (current) use of non-steroidal anti-inflammatories (NSAID): Secondary | ICD-10-CM | POA: Insufficient documentation

## 2016-05-15 DIAGNOSIS — M545 Low back pain: Secondary | ICD-10-CM | POA: Insufficient documentation

## 2016-05-15 DIAGNOSIS — M5441 Lumbago with sciatica, right side: Secondary | ICD-10-CM

## 2016-05-15 MED ORDER — PREDNISONE 10 MG (21) PO TBPK: 10.0000 mg | ORAL_TABLET | Freq: Every day | ORAL | 0 refills | Status: DC

## 2016-05-15 NOTE — ED Provider Notes (Signed)
[facility] Emergency Department Provider Note  ____________________________________________   First MD Initiated Contact with Patient 05/15/16 2102     (approximate)  I have reviewed the triage vital signs and the nursing notes.   HISTORY  Chief Complaint Back Pain    HPI Zachary Conrad is a 25 y.o. male presenting with low back pain that started 2 weeks ago. Patient currently rates his back pain at 6 out of 10 and describes it as "numb". He states that back pain radiates down the posterolateral aspect of his right leg to the knee. Patient denies pain in the groin or bowl/bladder incontinence. Denies hematuria and dysuria. He states that he exercises daily. He denies weakness. Patient states that pain is worsened with walking and with activities that involve extension. He has taken ibuprofen daily, which has not improved his symptoms. Patient denies falls or other incidences of trauma.   Past Medical History:  Diagnosis Date  . Kidney stones     There are no active problems to display for this patient.   Past Surgical History:  Procedure Laterality Date  . TONSILLECTOMY      Prior to Admission medications   Medication Sig Start Date End Date Taking? Authorizing Provider  diphenhydrAMINE (BENADRYL) 50 MG capsule Take 50 mg by mouth every 6 (six) hours as needed.    Historical Provider, MD  doxycycline (VIBRAMYCIN) 100 MG capsule Take 1 capsule (100 mg total) by mouth 2 (two) times daily. 11/17/15   Sharman CheekPhillip Stafford, MD  metroNIDAZOLE (FLAGYL) 500 MG tablet Take 1 tablet (500 mg total) by mouth 3 (three) times daily. 11/17/15   Sharman CheekPhillip Stafford, MD  naproxen (NAPROSYN) 500 MG tablet Take 1 tablet (500 mg total) by mouth 2 (two) times daily with a meal. 11/17/15   Sharman CheekPhillip Stafford, MD  predniSONE (STERAPRED UNI-PAK 21 TAB) 10 MG (21) TBPK tablet Take 1 tablet (10 mg total) by mouth daily. 05/15/16   Orvil FeilJaclyn M Alaya Iverson, PA-C  promethazine (PHENERGAN) 25 MG  tablet Take 1 tablet (25 mg total) by mouth every 6 (six) hours as needed for nausea or vomiting. 11/17/15   Sharman CheekPhillip Stafford, MD    Allergies Labetalol; Bactrim [sulfamethoxazole-trimethoprim]; Ciprofloxacin; and Keflex [cephalexin]  No family history on file.  Social History Social History  Substance Use Topics  . Smoking status: Never Smoker  . Smokeless tobacco: Never Used  . Alcohol use Yes    Review of Systems Constitutional: No fever/chills Eyes: No visual changes. ENT: No sore throat. Cardiovascular: Denies chest pain. Respiratory: Denies shortness of breath. Gastrointestinal: No abdominal pain.  No nausea, no vomiting.  No diarrhea.  No constipation.  Genitourinary: Negative for dysuria. Musculoskeletal: Has back pain. Skin: Negative for rash. Neurological: Negative for headaches, focal weakness or numbness.  10-point ROS otherwise negative.  ____________________________________________   PHYSICAL EXAM:  VITAL SIGNS: ED Triage Vitals  Enc Vitals Group     BP 05/15/16 2015 136/82     Pulse Rate 05/15/16 2015 89     Resp 05/15/16 2015 18     Temp 05/15/16 2015 98.9 F (37.2 C)     Temp Source 05/15/16 2015 Oral     SpO2 05/15/16 2015 98 %     Weight 05/15/16 2013 176 lb (79.8 kg)     Height 05/15/16 2013 5\' 9"  (1.753 m)     Head Circumference --      Peak Flow --      Pain Score 05/15/16 2013 8  Pain Loc --      Pain Edu? --      Excl. in GC? --     Constitutional: Alert and oriented. Well appearing and in no acute distress. Eyes: Conjunctivae are normal. PERRL. EOMI. Head: Atraumatic. Nose: No congestion/rhinnorhea. Mouth/Throat: Mucous membranes are moist.  Oropharynx non-erythematous. Neck: No stridor.  Full range of motion. Cardiovascular: Normal rate, regular rhythm. Grossly normal heart sounds.  Good peripheral circulation. Respiratory: Normal respiratory effort.  No retractions. Lungs CTAB. Gastrointestinal: Soft and nontender. No  distention. No abdominal bruits. No CVA tenderness. Musculoskeletal: No lower extremity tenderness nor edema.  No joint effusions. Patient has 5 out of 5 strength in the upper and lower extremities bilaterally. Neurologic:  Normal speech and language. No gross focal neurologic deficits are appreciated. No gait instability. Reflexes within normal range. Patient's back pain was worsened with extension. Patient's pain occurred along L1-L2 dermatomes. Negative straight leg raise test. Skin:  Skin is warm, dry and intact. No rash noted. Psychiatric: Mood and affect are normal. Speech and behavior are normal.  ____________________________________________   LABS (all labs ordered are listed, but only abnormal results are displayed)  Labs Reviewed - No data to display ____________________________________________   RADIOLOGY  I, Orvil FeilJaclyn M Keaden Gunnoe, personally viewed and evaluated these images (plain radiographs) as part of my medical decision making, as well as reviewing the written report by the radiologist.  Lumbar Xray: L4/L5 disc space narrowing.   PROCEDURES  Procedure(s) performed: None   Procedures    ____________________________________________   INITIAL IMPRESSION / ASSESSMENT AND PLAN / ED COURSE  Pertinent labs & imaging results that were available during my care of the patient were reviewed by me and considered in my medical decision making (see chart for details).    Clinical Course    Assessment: Low back pain Patient has experienced low back pain with radicular symptoms along L1-L2 dermatomes for the past 2 weeks. Patient denies bowel or bladder incontinence, decreasing suspicion for cauda equina syndrome.   Plan:  A referral was made to orthopedics, Dr Rosita KeaMenz. Patient was prescribed prednisone to reduce inflammation that might be inducing neurologic pain. Patient was advised to refrain from heavy lifting until back pain symptoms subside. All patient questions were  answered. Patient was advised to return to the emergency department if symptoms were to acutely worsen.  ____________________________________________   FINAL CLINICAL IMPRESSION(S) / ED DIAGNOSES  Final diagnoses:  Acute right-sided low back pain with right-sided sciatica      NEW MEDICATIONS STARTED DURING THIS VISIT:  Discharge Medication List as of 05/15/2016 10:24 PM    START taking these medications   Details  predniSONE (STERAPRED UNI-PAK 21 TAB) 10 MG (21) TBPK tablet Take 1 tablet (10 mg total) by mouth daily., Starting Thu 05/15/2016, Print         Note:  This document was prepared using Dragon voice recognition software and may include unintentional dictation errors.    Orvil FeilJaclyn M Madelin Weseman, PA-C 05/15/16 2355    Willy EddyPatrick Robinson, MD 05/15/16 [phone]

## 2016-05-15 NOTE — ED Triage Notes (Signed)
Patient ambulatory to triage with steady gait, without difficulty or distress noted; pt reports began new job, on feet 10hours/day; c/o lower back pain radiating down right leg; st hx sciatica in past

## 2016-05-15 NOTE — ED Notes (Signed)
Reviewed d/c instructions, follow-up care and prescription with pt. Pt verbalized understanding 

## 2016-05-15 NOTE — ED Notes (Signed)
Pt lower back pain radiating down right leg. Pt denies any injury, however reports he works out at gym 3/week

## 2016-05-28 MED ORDER — ZIPRASIDONE MESYLATE 20 MG IM SOLR
INTRAMUSCULAR | Status: AC
  Filled 2016-05-28: qty 20

## 2016-07-27 ENCOUNTER — Emergency Department
Admission: EM | Admit: 2016-07-27 | Discharge: 2016-07-27 | Disposition: A | Discharge status: Home / Self Care | Payer: No Typology Code available for payment source | Attending: Emergency Medicine | Admitting: Emergency Medicine

## 2016-07-27 ENCOUNTER — Encounter: Payer: Self-pay | Admitting: Emergency Medicine

## 2016-07-27 ENCOUNTER — Emergency Department: Payer: No Typology Code available for payment source

## 2016-07-27 DIAGNOSIS — Y9389 Activity, other specified: Secondary | ICD-10-CM | POA: Insufficient documentation

## 2016-07-27 DIAGNOSIS — Y92410 Unspecified street and highway as the place of occurrence of the external cause: Secondary | ICD-10-CM | POA: Insufficient documentation

## 2016-07-27 DIAGNOSIS — M545 Low back pain: Secondary | ICD-10-CM | POA: Insufficient documentation

## 2016-07-27 DIAGNOSIS — Y999 Unspecified external cause status: Secondary | ICD-10-CM | POA: Insufficient documentation

## 2016-07-27 DIAGNOSIS — S3992XA Unspecified injury of lower back, initial encounter: Secondary | ICD-10-CM | POA: Diagnosis present

## 2016-07-27 MED ORDER — NAPROXEN 500 MG PO TABS
500.0000 mg | ORAL_TABLET | Freq: Once | ORAL | Status: AC
  Administered 2016-07-27: 500 mg via ORAL
  Filled 2016-07-27: qty 1

## 2016-07-27 MED ORDER — MELOXICAM 7.5 MG PO TABS: 7.5000 mg | ORAL_TABLET | Freq: Every day | ORAL | 1 refills | Status: DC

## 2016-07-27 MED ORDER — CYCLOBENZAPRINE HCL 5 MG PO TABS: 5.0000 mg | ORAL_TABLET | Freq: Three times a day (TID) | ORAL | 0 refills | Status: DC | PRN

## 2016-07-27 MED ORDER — BACLOFEN 10 MG PO TABS: 10.0000 mg | ORAL_TABLET | Freq: Every day | ORAL | 1 refills | Status: AC

## 2016-07-27 NOTE — ED Notes (Signed)
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
## 2016-07-27 NOTE — ED Notes (Signed)
Pt reports flexeril makes him nauseated. PA notified, prescription changed to baclofen.
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
## 2017-01-19 ENCOUNTER — Emergency Department (HOSPITAL_COMMUNITY)
Admission: EM | Admit: 2017-01-19 | Discharge: 2017-01-19 | Disposition: A | Discharge status: Home / Self Care | Payer: Self-pay | Attending: Emergency Medicine | Admitting: Emergency Medicine

## 2017-01-19 ENCOUNTER — Encounter (HOSPITAL_COMMUNITY): Payer: Self-pay

## 2017-01-19 DIAGNOSIS — N23 Unspecified renal colic: Secondary | ICD-10-CM | POA: Insufficient documentation

## 2017-01-19 LAB — BASIC METABOLIC PANEL
Anion gap: 8 (ref 5–15)
BUN: 13 mg/dL (ref 6–20)
CO2: 28 mmol/L (ref 22–32)
CREATININE: 1.1 mg/dL (ref 0.61–1.24)
Calcium: 9.2 mg/dL (ref 8.9–10.3)
Chloride: 103 mmol/L (ref 101–111)
Glucose, Bld: 97 mg/dL (ref 65–99)
Potassium: 3.4 mmol/L — ABNORMAL LOW (ref 3.5–5.1)
SODIUM: 139 mmol/L (ref 135–145)

## 2017-01-19 LAB — URINALYSIS, ROUTINE W REFLEX MICROSCOPIC
BACTERIA UA: NONE SEEN
BILIRUBIN URINE: NEGATIVE
GLUCOSE, UA: NEGATIVE mg/dL
KETONES UR: NEGATIVE mg/dL
Leukocytes, UA: NEGATIVE
Nitrite: NEGATIVE
PH: 7 (ref 5.0–8.0)
PROTEIN: NEGATIVE mg/dL
Specific Gravity, Urine: 1.011 (ref 1.005–1.030)

## 2017-01-19 LAB — CBC
HCT: 41.4 % (ref 39.0–52.0)
Hemoglobin: 14.5 g/dL (ref 13.0–17.0)
MCH: 29.4 pg (ref 26.0–34.0)
MCHC: 35 g/dL (ref 30.0–36.0)
MCV: 83.8 fL (ref 78.0–100.0)
PLATELETS: 282 10*3/uL (ref 150–400)
RBC: 4.94 MIL/uL (ref 4.22–5.81)
RDW: 13 % (ref 11.5–15.5)
WBC: 9.8 10*3/uL (ref 4.0–10.5)

## 2017-01-19 MED ORDER — KETOROLAC TROMETHAMINE 30 MG/ML IJ SOLN
30.0000 mg | Freq: Once | INTRAMUSCULAR | Status: AC
  Administered 2017-01-19: 30 mg via INTRAVENOUS
  Filled 2017-01-19: qty 1

## 2017-01-19 MED ORDER — OXYCODONE-ACETAMINOPHEN 5-325 MG PO TABS: 1.0000 | ORAL_TABLET | ORAL | 0 refills | Status: DC | PRN

## 2017-01-19 MED ORDER — ONDANSETRON HCL 4 MG/2ML IJ SOLN
4.0000 mg | Freq: Once | INTRAMUSCULAR | Status: AC
  Administered 2017-01-19: 4 mg via INTRAVENOUS
  Filled 2017-01-19: qty 2

## 2017-01-19 MED ORDER — PROMETHAZINE HCL 25 MG PO TABS: 25.0000 mg | ORAL_TABLET | Freq: Four times a day (QID) | ORAL | 0 refills | Status: DC | PRN

## 2017-01-19 MED ORDER — HYDROMORPHONE HCL 1 MG/ML IJ SOLN
1.0000 mg | Freq: Once | INTRAMUSCULAR | Status: AC
  Administered 2017-01-19: 1 mg via INTRAVENOUS
  Filled 2017-01-19: qty 1

## 2017-01-19 NOTE — ED Triage Notes (Signed)
Pt c/o of rt flank pain, 7/10 that began 2-3 days ago pt had Hx of kidney stones and kidney failure.

## 2017-01-19 NOTE — ED Provider Notes (Signed)
[facility]-EMERGENCY DEPT Provider Note   CSN: [id number] Arrival date & time: 01/19/17  0058     History   Chief Complaint Chief Complaint  Patient presents with  . Flank Pain    HPI ERAN MISTRY is a 26 y.o. male.  Patient presents to the emergency part for evaluation of right flank pain. Patient has been having intermittent sharp stabbing pains in the right flank for the last 2 days. This feels similar to pain he has had with kidney stones, has not noticed any urinary frequency, dysuria or hematuria. No associated fever, nausea or vomiting. Pain waxes and wanes, is severe at times.      Past Medical History:  Diagnosis Date  . Kidney stones     There are no active problems to display for this patient.   Past Surgical History:  Procedure Laterality Date  . TONSILLECTOMY         Home Medications    Prior to Admission medications   Medication Sig Start Date End Date Taking? Authorizing Provider  diphenhydrAMINE (BENADRYL) 50 MG capsule Take 50 mg by mouth every 6 (six) hours as needed.   Yes [provider]  ibuprofen (ADVIL,MOTRIN) 800 MG tablet Take 800 mg by mouth every 6 (six) hours as needed for headache, mild pain or moderate pain.   Yes [provider]  meloxicam (MOBIC) 7.5 MG tablet Take 1 tablet (7.5 mg total) by mouth daily. Patient not taking: Reported on 01/19/2017 07/27/16 07/27/17  Orvil Feil, PA-C  oxyCODONE-acetaminophen (PERCOCET) 5-325 MG tablet Take 1-2 tablets by mouth every 4 (four) hours as needed. 01/19/17   Gilda Crease, MD  promethazine (PHENERGAN) 25 MG tablet Take 1 tablet (25 mg total) by mouth every 6 (six) hours as needed for nausea or vomiting. 01/19/17   Matsue Strom, Canary Brim, MD    Family History History reviewed. No pertinent family history.  Social History Social History  Substance Use Topics  . Smoking status: Never Smoker  . Smokeless tobacco: Never Used  . Alcohol use Yes     Allergies    Labetalol; Ondansetron; Azithromycin; Bactrim [sulfamethoxazole-trimethoprim]; Ciprofloxacin; Flexeril [cyclobenzaprine]; Keflex [cephalexin]; and Strawberry extract   Review of Systems Review of Systems  Genitourinary: Positive for flank pain.  All other systems reviewed and are negative.    Physical Exam Updated Vital Signs BP 140/66 (BP Location: Right Arm)   Pulse 74   Temp 98.6 F (37 C) (Oral)   Resp 18   Ht 5\' 10"  (1.778 m)   Wt 86.2 kg (190 lb)   SpO2 100%   BMI 27.26 kg/m   Physical Exam  Constitutional: He is oriented to person, place, and time. He appears well-developed and well-nourished. No distress.  HENT:  Head: Normocephalic and atraumatic.  Right Ear: Hearing normal.  Left Ear: Hearing normal.  Nose: Nose normal.  Mouth/Throat: Oropharynx is clear and moist and mucous membranes are normal.  Eyes: Pupils are equal, round, and reactive to light. Conjunctivae and EOM are normal.  Neck: Normal range of motion. Neck supple.  Cardiovascular: Regular rhythm, S1 normal and S2 normal.  Exam reveals no gallop and no friction rub.   No murmur heard. Pulmonary/Chest: Effort normal and breath sounds normal. No respiratory distress. He exhibits no tenderness.  Abdominal: Soft. Normal appearance and bowel sounds are normal. There is no hepatosplenomegaly. There is no tenderness. There is no rebound, no guarding, no tenderness at McBurney's point and negative Murphy's sign. No hernia.  Musculoskeletal: Normal  range of motion.  Neurological: He is alert and oriented to person, place, and time. He has normal strength. No cranial nerve deficit or sensory deficit. Coordination normal. GCS eye subscore is 4. GCS verbal subscore is 5. GCS motor subscore is 6.  Skin: Skin is warm, dry and intact. No rash noted. No cyanosis.  Psychiatric: He has a normal mood and affect. His speech is normal and behavior is normal. Thought content normal.  Nursing note and vitals  reviewed.    ED Treatments / Results  Labs (all labs ordered are listed, but only abnormal results are displayed) Labs Reviewed  URINALYSIS, ROUTINE W REFLEX MICROSCOPIC - Abnormal; Notable for the following:       Result Value   Color, Urine STRAW (*)    Hgb urine dipstick LARGE (*)    Squamous Epithelial / LPF 0-5 (*)    All other components within normal limits  BASIC METABOLIC PANEL - Abnormal; Notable for the following:    Potassium 3.4 (*)    All other components within normal limits  CBC    EKG  EKG Interpretation None       Radiology No results found.  Procedures Procedures (including critical care time)  Medications Ordered in ED Medications  HYDROmorphone (DILAUDID) injection 1 mg (1 mg Intravenous Given 01/19/17 0142)  ondansetron (ZOFRAN) injection 4 mg (4 mg Intravenous Given 01/19/17 0143)  ketorolac (TORADOL) 30 MG/ML injection 30 mg (30 mg Intravenous Given 01/19/17 0143)     Initial Impression / Assessment and Plan / ED Course  I have reviewed the triage vital signs and the nursing notes.  Pertinent labs & imaging results that were available during my care of the patient were reviewed by me and considered in my medical decision making (see chart for details).     Patient presents to ER with right flank pain. Symptoms ongoing 2 days, intermittently. He reports symptoms are similar to renal colic he has had in the past and urinalysis shows blood without signs of infection. He is much improved after treatment here in the ER. Does not require imaging at this time, symptoms presumably secondary to ureteral stone. Will treat as an outpatient with analgesia, follow up with urology. Given return precautions.  Final Clinical Impressions(s) / ED Diagnoses   Final diagnoses:  Ureteral colic    New Prescriptions New Prescriptions   OXYCODONE-ACETAMINOPHEN (PERCOCET) 5-325 MG TABLET    Take 1-2 tablets by mouth every 4 (four) hours as needed.   PROMETHAZINE  (PHENERGAN) 25 MG TABLET    Take 1 tablet (25 mg total) by mouth every 6 (six) hours as needed for nausea or vomiting.     Gilda CreasePollina, Krisalyn Yankowski J, MD 01/19/17 [id number]

## 2017-01-24 ENCOUNTER — Encounter (HOSPITAL_COMMUNITY): Payer: Self-pay | Admitting: Emergency Medicine

## 2017-01-24 ENCOUNTER — Emergency Department (HOSPITAL_COMMUNITY)
Admission: EM | Admit: 2017-01-24 | Discharge: 2017-01-24 | Disposition: A | Discharge status: Home / Self Care | Payer: Self-pay | Attending: Emergency Medicine | Admitting: Emergency Medicine

## 2017-01-24 ENCOUNTER — Emergency Department (HOSPITAL_COMMUNITY): Payer: Self-pay

## 2017-01-24 DIAGNOSIS — R1031 Right lower quadrant pain: Secondary | ICD-10-CM | POA: Insufficient documentation

## 2017-01-24 DIAGNOSIS — R109 Unspecified abdominal pain: Secondary | ICD-10-CM

## 2017-01-24 LAB — URINALYSIS, ROUTINE W REFLEX MICROSCOPIC
BILIRUBIN URINE: NEGATIVE
Glucose, UA: NEGATIVE mg/dL
Ketones, ur: NEGATIVE mg/dL
LEUKOCYTES UA: NEGATIVE
NITRITE: NEGATIVE
PH: 7 (ref 5.0–8.0)
Protein, ur: NEGATIVE mg/dL
SPECIFIC GRAVITY, URINE: 1.029 (ref 1.005–1.030)

## 2017-01-24 MED ORDER — SODIUM CHLORIDE 0.9 % IV BOLUS (SEPSIS)
1000.0000 mL | Freq: Once | INTRAVENOUS | Status: AC
  Administered 2017-01-24: 1000 mL via INTRAVENOUS

## 2017-01-24 MED ORDER — KETOROLAC TROMETHAMINE 60 MG/2ML IM SOLN
60.0000 mg | Freq: Once | INTRAMUSCULAR | Status: DC
  Filled 2017-01-24: qty 2

## 2017-01-24 MED ORDER — PROMETHAZINE HCL 25 MG/ML IJ SOLN
25.0000 mg | Freq: Once | INTRAMUSCULAR | Status: DC
  Filled 2017-01-24: qty 1

## 2017-01-24 MED ORDER — PROMETHAZINE HCL 25 MG/ML IJ SOLN
12.5000 mg | Freq: Once | INTRAMUSCULAR | Status: AC
  Administered 2017-01-24: 12.5 mg via INTRAVENOUS
  Filled 2017-01-24: qty 1

## 2017-01-24 MED ORDER — TRAMADOL HCL 50 MG PO TABS: 50.0000 mg | ORAL_TABLET | Freq: Four times a day (QID) | ORAL | 0 refills | Status: DC | PRN

## 2017-01-24 MED ORDER — KETOROLAC TROMETHAMINE 30 MG/ML IJ SOLN
30.0000 mg | Freq: Once | INTRAMUSCULAR | Status: AC
  Administered 2017-01-24: 30 mg via INTRAVENOUS

## 2017-01-24 NOTE — ED Triage Notes (Signed)
Patient is complaining of right flank pain. Patient states he was diagnosed with kidney stones 2 days ago. Patient states that his chest is hurting and he feels like he is going to throw up.

## 2017-01-24 NOTE — ED Notes (Signed)
Ultrasound in process at bedside at this time. 

## 2017-01-24 NOTE — Discharge Instructions (Signed)
You will need to follow up with the Urologist provided. Return here as needed.

## 2017-01-24 NOTE — ED Provider Notes (Signed)
[facility]-EMERGENCY DEPT Provider Note   CSN: [id number] Arrival date & time: 01/24/17  0208     History   Chief Complaint Chief Complaint  Patient presents with  . Flank Pain    HPI Zachary Conrad is a 26 y.o. male.  HPI Patient presents to the emergency department with right flank pain that started 2 days ago.  The patient has a chronic history of flank pain.  The patient states that he is nauseated, as well.  Patient states that this feels similar to previous episodes with his kidneys.  Patient states that nothing seems make the condition better or worse.  He states he did not take any medications prior to arrivalThe patient denies chest pain, shortness of breath, headache,blurred vision, neck pain, fever, cough, weakness, numbness, dizziness, anorexia, edema,  vomiting, diarrhea, rash, back pain, dysuria, hematemesis, bloody stool, near syncope, or syncope. Past Medical History:  Diagnosis Date  . Kidney stones     There are no active problems to display for this patient.   Past Surgical History:  Procedure Laterality Date  . TONSILLECTOMY         Home Medications    Prior to Admission medications   Medication Sig Start Date End Date Taking? Authorizing Provider  diphenhydrAMINE (BENADRYL) 50 MG capsule Take 50 mg by mouth every 6 (six) hours as needed.   Yes [provider]  ibuprofen (ADVIL,MOTRIN) 800 MG tablet Take 800 mg by mouth every 6 (six) hours as needed for headache, mild pain or moderate pain.   Yes [provider]  oxyCODONE-acetaminophen (PERCOCET) 5-325 MG tablet Take 1-2 tablets by mouth every 4 (four) hours as needed. 01/19/17  Yes Pollina, Canary Brimhristopher J, MD  meloxicam (MOBIC) 7.5 MG tablet Take 1 tablet (7.5 mg total) by mouth daily. Patient not taking: Reported on 01/19/2017 07/27/16 07/27/17  Orvil FeilWoods, Jaclyn M, PA-C  promethazine (PHENERGAN) 25 MG tablet Take 1 tablet (25 mg total) by mouth every 6 (six) hours as needed for nausea or  vomiting. Patient not taking: Reported on 01/24/2017 01/19/17   Gilda CreasePollina, Delena Casebeer J, MD    Family History History reviewed. No pertinent family history.  Social History Social History  Substance Use Topics  . Smoking status: Never Smoker  . Smokeless tobacco: Never Used  . Alcohol use Yes     Allergies   Labetalol; Ondansetron; Azithromycin; Bactrim [sulfamethoxazole-trimethoprim]; Ciprofloxacin; Flexeril [cyclobenzaprine]; Keflex [cephalexin]; and Strawberry extract   Review of Systems Review of Systems All other systems negative except as documented in the HPI. All pertinent positives and negatives as reviewed in the HPI.  Physical Exam Updated Vital Signs BP 135/84 (BP Location: Right Arm)   Pulse 87   Temp 97.8 F (36.6 C) (Oral)   Resp 20   Ht 5\' 10"  (1.778 m)   Wt 86.2 kg (190 lb)   SpO2 100%   BMI 27.26 kg/m   Physical Exam  Constitutional: He is oriented to person, place, and time. He appears well-developed and well-nourished. No distress.  HENT:  Head: Normocephalic and atraumatic.  Mouth/Throat: Oropharynx is clear and moist.  Eyes: Pupils are equal, round, and reactive to light.  Neck: Normal range of motion. Neck supple.  Cardiovascular: Normal rate, regular rhythm and normal heart sounds.  Exam reveals no gallop and no friction rub.   No murmur heard. Pulmonary/Chest: Effort normal and breath sounds normal. No respiratory distress. He has no wheezes.  Abdominal: Soft. Bowel sounds are normal. He exhibits no distension and no mass.  There is tenderness. There is no rebound and no guarding.  Neurological: He is alert and oriented to person, place, and time. He exhibits normal muscle tone. Coordination normal.  Skin: Skin is warm and dry. Capillary refill takes less than 2 seconds. No rash noted. No erythema.  Psychiatric: He has a normal mood and affect. His behavior is normal.  Nursing note and vitals reviewed.    ED Treatments / Results  Labs (all  labs ordered are listed, but only abnormal results are displayed) Labs Reviewed  URINALYSIS, ROUTINE W REFLEX MICROSCOPIC - Abnormal; Notable for the following:       Result Value   APPearance HAZY (*)    Hgb urine dipstick MODERATE (*)    Bacteria, UA RARE (*)    Squamous Epithelial / LPF 0-5 (*)    All other components within normal limits    EKG  EKG Interpretation None       Radiology US Renal  Result Date: 01/24/2017 CLINICAL DATA:  Right flank pain and hematuria. History kidney stones. EXAM: RENAL / URINARY TRACT ULTRASOUND COMPLETE COMPARISON:  CT 06/12/2016 FINDINGS: Right Kidney: Length: 10.5 cm. Echogenicity within normal limits. No mass or hydronephrosis visualized. No evident shadowing calculi. Left Kidney: Length: 11.1 cm. Echogenicity within normal limits. No mass or hydronephrosis visualized. No evident shadowing calculi. Bladder: Appears normal for degree of bladder distention. Post ureteral jets are visualized. IMPRESSION: No hydronephrosis. Bilateral ureteral jets are visualized. No stones demonstrated sonographically. Electronically Signed   By: Rubye Oaks M.D.   On: 01/24/2017 05:28    Procedures Procedures (including critical care time)  Medications Ordered in ED Medications  ketorolac (TORADOL) 30 MG/ML injection 30 mg (30 mg Intravenous Given 01/24/17 0419)  promethazine (PHENERGAN) injection 12.5 mg (12.5 mg Intravenous Given 01/24/17 0420)  sodium chloride 0.9 % bolus 1,000 mL (0 mLs Intravenous Stopped 01/24/17 0607)     Initial Impression / Assessment and Plan / ED Course  I have reviewed the triage vital signs and the nursing notes.  Pertinent labs & imaging results that were available during my care of the patient were reviewed by me and considered in my medical decision making (see chart for details).     Patient has an ultrasound did not show any abnormalities.  At this time.  He has had multiple CT scans cross multiple health systems.  I  feel that the patient will need follow-up with urology given pain control here in the emergency department along with pain control for home.  Told to return here as needed.  Patient is stable at this time  Final Clinical Impressions(s) / ED Diagnoses   Final diagnoses:  None    New Prescriptions New Prescriptions   No medications on file     Charlestine Night, Cordelia Poche 01/25/17 1610    Rolland Porter, MD 01/25/17 [phone]

## 2017-07-27 ENCOUNTER — Emergency Department
Admission: EM | Admit: 2017-07-27 | Discharge: 2017-07-27 | Disposition: A | Discharge status: Home / Self Care | Payer: Self-pay | Attending: Emergency Medicine | Admitting: Emergency Medicine

## 2017-07-27 ENCOUNTER — Emergency Department: Payer: Self-pay

## 2017-07-27 DIAGNOSIS — Y9289 Other specified places as the place of occurrence of the external cause: Secondary | ICD-10-CM | POA: Insufficient documentation

## 2017-07-27 DIAGNOSIS — Y9389 Activity, other specified: Secondary | ICD-10-CM | POA: Insufficient documentation

## 2017-07-27 DIAGNOSIS — S6991XA Unspecified injury of right wrist, hand and finger(s), initial encounter: Secondary | ICD-10-CM | POA: Insufficient documentation

## 2017-07-27 DIAGNOSIS — R109 Unspecified abdominal pain: Secondary | ICD-10-CM | POA: Insufficient documentation

## 2017-07-27 DIAGNOSIS — R319 Hematuria, unspecified: Secondary | ICD-10-CM | POA: Insufficient documentation

## 2017-07-27 DIAGNOSIS — Y999 Unspecified external cause status: Secondary | ICD-10-CM | POA: Insufficient documentation

## 2017-07-27 DIAGNOSIS — W2209XA Striking against other stationary object, initial encounter: Secondary | ICD-10-CM | POA: Insufficient documentation

## 2017-07-27 LAB — COMPREHENSIVE METABOLIC PANEL
ALBUMIN: 4.6 g/dL (ref 3.5–5.0)
ALT: 44 U/L (ref 17–63)
AST: 23 U/L (ref 15–41)
Alkaline Phosphatase: 70 U/L (ref 38–126)
Anion gap: 9 (ref 5–15)
BUN: 20 mg/dL (ref 6–20)
CO2: 24 mmol/L (ref 22–32)
CREATININE: 1.04 mg/dL (ref 0.61–1.24)
Calcium: 9.2 mg/dL (ref 8.9–10.3)
Chloride: 104 mmol/L (ref 101–111)
GFR calc non Af Amer: >60 mL/min (ref >60)
GLUCOSE: 99 mg/dL (ref 65–99)
Potassium: 4 mmol/L (ref 3.5–5.1)
SODIUM: 137 mmol/L (ref 135–145)
Total Bilirubin: 0.7 mg/dL (ref 0.3–1.2)
Total Protein: 7.9 g/dL (ref 6.5–8.1)

## 2017-07-27 LAB — URINALYSIS, COMPLETE (UACMP) WITH MICROSCOPIC
BACTERIA UA: NONE SEEN
BILIRUBIN URINE: NEGATIVE
Glucose, UA: NEGATIVE mg/dL
Ketones, ur: NEGATIVE mg/dL
LEUKOCYTES UA: NEGATIVE
Nitrite: NEGATIVE
PROTEIN: NEGATIVE mg/dL
Specific Gravity, Urine: 1.026 (ref 1.005–1.030)
pH: 6 (ref 5.0–8.0)

## 2017-07-27 LAB — CBC
HCT: 43.6 % (ref 40.0–52.0)
HEMOGLOBIN: 15.3 g/dL (ref 13.0–18.0)
MCH: 30.1 pg (ref 26.0–34.0)
MCHC: 35 g/dL (ref 32.0–36.0)
MCV: 85.8 fL (ref 80.0–100.0)
PLATELETS: 306 10*3/uL (ref 150–440)
RBC: 5.08 MIL/uL (ref 4.40–5.90)
RDW: 13 % (ref 11.5–14.5)
WBC: 8.6 10*3/uL (ref 3.8–10.6)

## 2017-07-27 MED ORDER — MELOXICAM 15 MG PO TABS: 15.0000 mg | ORAL_TABLET | Freq: Every day | ORAL | 0 refills | Status: AC

## 2017-07-27 NOTE — ED Notes (Signed)
Pt states being seen at urgent care a few weeks ago for possible kidney stones, has not seen one in the strainer thus far, unsure if infection present at that time. Pt states today pain was worse, was in the middle of painting his house and punched the floor in frustration from the pain.   Pt right hand swollen, red knuckles on the last 3 digits.

## 2017-07-27 NOTE — ED Triage Notes (Signed)
Pt states that he was in pain from a kidney stone and that he was on the floor and got made because of pain and punched the floor.  Pt here to be evaluated for hand pain.  Pt A&Ox4, in NAD, ambulatory to triage.

## 2017-07-27 NOTE — ED Provider Notes (Signed)
[facility] Emergency Department Provider Note  ____________________________________________  Time seen: Approximately 9:31 PM  I have reviewed the triage vital signs and the nursing notes.   HISTORY  Chief Complaint Hand Injury    HPI Zachary Conrad is a 27 y.o. male that presents to emergency department for evaluation of right hand pain after punching the floor today and right flank pain for 3 weeks.  Patient went to urgent care 2 weeks ago and was told that he likely has a kidney stone.  He has been taking Flomax with little relief.  Right side pain comes and goes. It starts in his back, radiates around his right side and down to his groin.  He has not seen any blood but states that his urine is dark.  He was given a urology referral but has not followed up yet. This morning, he was painting a house when pain returned and he punched the floor.  Pain is primarily over his fourth and fifth fingers.  He denies numbness, tingling, nausea, vomiting, abdominal pain, dysuria,  penile discharge.   Past Medical History:  Diagnosis Date  . Kidney stones     There are no active problems to display for this patient.   Past Surgical History:  Procedure Laterality Date  . TONSILLECTOMY      Prior to Admission medications   Medication Sig Start Date End Date Taking? Authorizing Provider  diphenhydrAMINE (BENADRYL) 50 MG capsule Take 50 mg by mouth every 6 (six) hours as needed.    [provider]  ibuprofen (ADVIL,MOTRIN) 800 MG tablet Take 800 mg by mouth every 6 (six) hours as needed for headache, mild pain or moderate pain.    [provider]  meloxicam (MOBIC) 15 MG tablet Take 1 tablet (15 mg total) by mouth daily for 10 days. 07/27/17 08/06/17  Enid Derry, PA-C  oxyCODONE-acetaminophen (PERCOCET) 5-325 MG tablet Take 1-2 tablets by mouth every 4 (four) hours as needed. 01/19/17   Gilda Crease, MD  promethazine (PHENERGAN) 25 MG  tablet Take 1 tablet (25 mg total) by mouth every 6 (six) hours as needed for nausea or vomiting. Patient not taking: Reported on 01/24/2017 01/19/17   Gilda Crease, MD  traMADol (ULTRAM) 50 MG tablet Take 1 tablet (50 mg total) by mouth every 6 (six) hours as needed for severe pain. 01/24/17   Lawyer, Cristal Deer, PA-C    Allergies Labetalol; Ondansetron; Azithromycin; Bactrim [sulfamethoxazole-trimethoprim]; Ciprofloxacin; Flexeril [cyclobenzaprine]; Keflex [cephalexin]; and Strawberry extract  No family history on file.  Social History Social History   Tobacco Use  . Smoking status: Never Smoker  . Smokeless tobacco: Never Used  Substance Use Topics  . Alcohol use: Yes  . Drug use: No     Review of Systems  Cardiovascular: No chest pain. Respiratory: No SOB. Gastrointestinal: No nausea, no vomiting.  Genitourinary: Negative for dysuria. Musculoskeletal: Positive for hand pain. Skin: Negative for rash, abrasions, lacerations, ecchymosis. Neurological: Negative for headaches, numbness or tingling   ____________________________________________   PHYSICAL EXAM:  VITAL SIGNS: ED Triage Vitals  Enc Vitals Group     BP 07/27/17 2002 98/73     Pulse Rate 07/27/17 2002 88     Resp 07/27/17 2002 18     Temp 07/27/17 2002 98.5 F (36.9 C)     Temp Source 07/27/17 2002 Oral     SpO2 07/27/17 2002 100 %     Weight 07/27/17 2003 205 lb (93 kg)  Height 07/27/17 2003 5\' 9"  (1.753 m)     Head Circumference --      Peak Flow --      Pain Score 07/27/17 2006 10     Pain Loc --      Pain Edu? --      Excl. in GC? --      Constitutional: Alert and oriented. Well appearing and in no acute distress. Eyes: Conjunctivae are normal. PERRL. EOMI. Head: Atraumatic. ENT:      Ears:      Nose: No congestion/rhinnorhea.      Mouth/Throat: Mucous membranes are moist.  Neck: No stridor.   Cardiovascular: Normal rate, regular rhythm.  Good peripheral  circulation. Respiratory: Normal respiratory effort without tachypnea or retractions. Lungs CTAB. Good air entry to the bases with no decreased or absent breath sounds. Gastrointestinal: Bowel sounds 4 quadrants. Soft and nontender to palpation. No guarding or rigidity. No palpable masses. No distention. No CVA tenderness. Musculoskeletal: Full range of motion to all extremities. No gross deformities appreciated. Neurologic:  Normal speech and language. No gross focal neurologic deficits are appreciated.  Skin:  Skin is warm, dry and intact. No rash noted.   ____________________________________________   LABS (all labs ordered are listed, but only abnormal results are displayed)  Labs Reviewed  URINALYSIS, COMPLETE (UACMP) WITH MICROSCOPIC - Abnormal; Notable for the following components:      Result Value   Color, Urine YELLOW (*)    APPearance HAZY (*)    Hgb urine dipstick LARGE (*)    Squamous Epithelial / LPF 0-5 (*)    All other components within normal limits  CBC  COMPREHENSIVE METABOLIC PANEL   ____________________________________________  EKG   ____________________________________________  RADIOLOGY Lexine BatonI, Keldrick Pomplun, personally viewed and evaluated these images (plain radiographs) as part of my medical decision making, as well as reviewing the written report by the radiologist.  Dg Hand Complete Right  Result Date: 07/27/2017 CLINICAL DATA:  Hand injury, punched the floor EXAM: RIGHT HAND - COMPLETE 3+ VIEW COMPARISON:  05/09/2015 FINDINGS: There is no evidence of fracture or dislocation. There is no evidence of arthropathy or other focal bone abnormality. Soft tissues are unremarkable. IMPRESSION: No acute osseous abnormality Electronically Signed   By: Jasmine PangKim  Fujinaga M.D.   On: 07/27/2017 20:30   Ct Renal Stone Study  Result Date: 07/27/2017 CLINICAL DATA:  Nephrolithiasis and flank pain. EXAM: CT ABDOMEN AND PELVIS WITHOUT CONTRAST TECHNIQUE: Multidetector CT  imaging of the abdomen and pelvis was performed following the standard protocol without IV contrast. COMPARISON:  CT abdomen pelvis 06/12/2016 FINDINGS: Lower chest: No pulmonary nodules or pleural effusion. No visible pericardial effusion. Hepatobiliary: Normal hepatic contours and density. No visible biliary dilatation. Normal gallbladder. Pancreas: Normal contours without ductal dilatation. No peripancreatic fluid collection. Spleen: Normal. Adrenals/Urinary Tract: --Adrenal glands: Normal. --Right kidney/ureter: No hydronephrosis or perinephric stranding. No nephrolithiasis. No obstructing ureteral stones. --Left kidney/ureter: No hydronephrosis or perinephric stranding. No nephrolithiasis. No obstructing ureteral stones. --Urinary bladder: Unremarkable. Stomach/Bowel: --Stomach/Duodenum: No hiatal hernia or other gastric abnormality. Normal duodenal course and caliber. --Small bowel: No dilatation or inflammation. --Colon: No focal abnormality. --Appendix: Normal. Vascular/Lymphatic: Normal course and caliber of the major abdominal vessels. No abdominal or pelvic lymphadenopathy. Reproductive: No free fluid in the pelvis. Musculoskeletal. No bony spinal canal stenosis or focal osseous abnormality. Other: None. IMPRESSION: No acute abdominopelvic abnormality. No obstructive uropathy or nephrolithiasis. Electronically Signed   By: Deatra RobinsonKevin  Herman M.D.   On: 07/27/2017 22:31  ____________________________________________    PROCEDURES  Procedure(s) performed:    Procedures    Medications - No data to display   ____________________________________________   INITIAL IMPRESSION / ASSESSMENT AND PLAN / ED COURSE  Pertinent labs & imaging results that were available during my care of the patient were reviewed by me and considered in my medical decision making (see chart for details).  Review of the [place] CSRS was performed in accordance of the [facility] prior to dispensing any controlled  drugs.  Patient presented to the emergency department for evaluation of hand pain after punching a floor.  Vital signs and exam are reassuring.  Hand x-ray negative for acute bony abnormalities.  Patient has had on and off right flank pain for 3 weeks, which is what caused him to punch the floor. Urinalysis consistent with red blood cells.  CT renal stone study negative for kidney stone or hydronephrosis.  CBC and CMP are within normal limits.  Patient was encouraged to call urology in the morning.  Patient will be discharged with prescription for meloxicam.  Patient is to follow up with urology as directed. Patient is given ED precautions to return to the ED for any worsening or new symptoms.     ____________________________________________  FINAL CLINICAL IMPRESSION(S) / ED DIAGNOSES  Final diagnoses:  Hematuria, unspecified type  Injury of right hand, initial encounter      NEW MEDICATIONS STARTED DURING THIS VISIT:  ED Discharge Orders        Ordered    meloxicam (MOBIC) 15 MG tablet  Daily     07/27/17 2330          This chart was dictated using voice recognition software/Dragon. Despite best efforts to proofread, errors can occur which can change the meaning. Any change was purely unintentional.    Enid Derry, PA-C 07/27/17 2358    Dionne Bucy, MD 08/03/17 [phone]

## 2017-12-12 ENCOUNTER — Encounter: Payer: Self-pay | Admitting: Gynecology

## 2017-12-12 ENCOUNTER — Ambulatory Visit
Admission: EM | Admit: 2017-12-12 | Discharge: 2017-12-12 | Disposition: A | Discharge status: Home / Self Care | Payer: Self-pay | Attending: Family Medicine | Admitting: Family Medicine

## 2017-12-12 ENCOUNTER — Other Ambulatory Visit: Payer: Self-pay

## 2017-12-12 DIAGNOSIS — R3129 Other microscopic hematuria: Secondary | ICD-10-CM

## 2017-12-12 DIAGNOSIS — R1032 Left lower quadrant pain: Secondary | ICD-10-CM

## 2017-12-12 LAB — URINALYSIS, COMPLETE (UACMP) WITH MICROSCOPIC
Bacteria, UA: NONE SEEN
Glucose, UA: NEGATIVE mg/dL
Ketones, ur: 15 mg/dL — AB
Leukocytes, UA: NEGATIVE
Nitrite: NEGATIVE
PH: 6 (ref 5.0–8.0)
Protein, ur: 30 mg/dL — AB
RBC / HPF: >50 RBC/hpf (ref 0–5)
Squamous Epithelial / LPF: NONE SEEN (ref 0–5)

## 2017-12-12 MED ORDER — TAMSULOSIN HCL 0.4 MG PO CAPS: 0.4000 mg | ORAL_CAPSULE | Freq: Every day | ORAL | 0 refills | Status: DC

## 2017-12-12 MED ORDER — HYDROCODONE-ACETAMINOPHEN 5-325 MG PO TABS: ORAL_TABLET | ORAL | 0 refills | Status: DC

## 2017-12-12 NOTE — ED Triage Notes (Signed)
Per patient c/o on and off lower abdominal pain for the past few days. Painful to urinate.

## 2017-12-12 NOTE — Discharge Instructions (Signed)
Increase water intake

## 2017-12-12 NOTE — ED Provider Notes (Signed)
[facility]    CSN: [id number] Arrival date & time: 12/12/17  1504     History   Chief Complaint Chief Complaint  Patient presents with  . Abdominal Pain    HPI MARKOS THEIL is a 27 y.o. male.   27 yo male with a h/o kidney stones, presents with a c/o left flank and lower abdominal pain for 2-3 days associated with pain when urinating. States this is similar to prior episodes of kidney stones in the past. Denies any fevers, chills.   The history is provided by the patient.    Past Medical History:  Diagnosis Date  . Kidney stones     There are no active problems to display for this patient.   Past Surgical History:  Procedure Laterality Date  . HAND SURGERY Right   . TONSILLECTOMY         Home Medications    Prior to Admission medications   Medication Sig Start Date End Date Taking? Authorizing Provider  ibuprofen (ADVIL,MOTRIN) 800 MG tablet Take 800 mg by mouth every 6 (six) hours as needed for headache, mild pain or moderate pain.   Yes [provider]  promethazine (PHENERGAN) 25 MG tablet Take 1 tablet (25 mg total) by mouth every 6 (six) hours as needed for nausea or vomiting. 01/19/17  Yes Pollina, Canary Brim, MD  diphenhydrAMINE (BENADRYL) 50 MG capsule Take 50 mg by mouth every 6 (six) hours as needed.    [provider]  HYDROcodone-acetaminophen (NORCO/VICODIN) 5-325 MG tablet 1-2 tabs po bid prn 12/12/17   Payton Mccallum, MD  oxyCODONE-acetaminophen (PERCOCET) 5-325 MG tablet Take 1-2 tablets by mouth every 4 (four) hours as needed. 01/19/17   Gilda Crease, MD  tamsulosin (FLOMAX) 0.4 MG CAPS capsule Take 1 capsule (0.4 mg total) by mouth daily. 12/12/17   Payton Mccallum, MD  traMADol (ULTRAM) 50 MG tablet Take 1 tablet (50 mg total) by mouth every 6 (six) hours as needed for severe pain. 01/24/17   Charlestine Night, PA-C    Family History Family History  Problem Relation Age of Onset  . Healthy Mother     . Healthy Father     Social History Social History   Tobacco Use  . Smoking status: Never Smoker  . Smokeless tobacco: Never Used  Substance Use Topics  . Alcohol use: Yes  . Drug use: No     Allergies   Labetalol; Ondansetron; Azithromycin; Bactrim [sulfamethoxazole-trimethoprim]; Ciprofloxacin; Flexeril [cyclobenzaprine]; Keflex [cephalexin]; and Strawberry extract   Review of Systems Review of Systems   Physical Exam Triage Vital Signs ED Triage Vitals  Enc Vitals Group     BP 12/12/17 1529 129/89     Pulse Rate 12/12/17 1529 90     Resp 12/12/17 1529 16     Temp 12/12/17 1529 98.1 F (36.7 C)     Temp Source 12/12/17 1529 Oral     SpO2 12/12/17 1529 99 %     Weight 12/12/17 1529 190 lb (86.2 kg)     Height --      Head Circumference --      Peak Flow --      Pain Score 12/12/17 1528 5     Pain Loc --      Pain Edu? --      Excl. in GC? --    No data found.  Updated Vital Signs BP 129/89 (BP Location: Left Arm)   Pulse 90   Temp 98.1 F (36.7  C) (Oral)   Resp 16   Wt 190 lb (86.2 kg)   SpO2 99%   BMI 28.06 kg/m   Visual Acuity Right Eye Distance:   Left Eye Distance:   Bilateral Distance:    Right Eye Near:   Left Eye Near:    Bilateral Near:     Physical Exam  Constitutional: He is oriented to person, place, and time. He appears well-developed and well-nourished. No distress.  HENT:  Head: Normocephalic and atraumatic.  Cardiovascular: Normal rate, regular rhythm, normal heart sounds and intact distal pulses.  No murmur heard. Pulmonary/Chest: Effort normal and breath sounds normal. No stridor. No respiratory distress. He has no wheezes. He has no rales.  Abdominal: Soft. Bowel sounds are normal. He exhibits no distension and no mass. There is tenderness (mild, left lower quadrant). There is no rebound and no guarding.  Neurological: He is alert and oriented to person, place, and time.  Skin: No rash noted. He is not diaphoretic.   Nursing note and vitals reviewed.    UC Treatments / Results  Labs (all labs ordered are listed, but only abnormal results are displayed) Labs Reviewed  URINALYSIS, COMPLETE (UACMP) WITH MICROSCOPIC - Abnormal; Notable for the following components:      Result Value   APPearance HAZY (*)    Specific Gravity, Urine >1.030 (*)    Hgb urine dipstick MODERATE (*)    Bilirubin Urine SMALL (*)    Ketones, ur 15 (*)    Protein, ur 30 (*)    All other components within normal limits    EKG None  Radiology No results found.  Procedures Procedures (including critical care time)  Medications Ordered in UC Medications - No data to display  Initial Impression / Assessment and Plan / UC Course  I have reviewed the triage vital signs and the nursing notes.  Pertinent labs & imaging results that were available during my care of the patient were reviewed by me and considered in my medical decision making (see chart for details).     Final Clinical Impressions(s) / UC Diagnoses   Final diagnoses:  Abdominal pain, left lower quadrant  Other microscopic hematuria     Discharge Instructions     Increase water intake    ED Prescriptions    Medication Sig Dispense Auth. Provider   tamsulosin (FLOMAX) 0.4 MG CAPS capsule Take 1 capsule (0.4 mg total) by mouth daily. 10 capsule Payton Mccallumonty, Grenda Lora, MD   HYDROcodone-acetaminophen (NORCO/VICODIN) 5-325 MG tablet 1-2 tabs po bid prn 6 tablet Payton Mccallumonty, Yazleemar Strassner, MD     1. Lab results and diagnosis reviewed with patient 2. rx as per orders above; reviewed possible side effects, interactions, risks and benefits  3. Recommend supportive treatment as above 4. Follow-up prn if symptoms worsen or don't improve  Controlled Substance Prescriptions [place] Controlled Substance Registry consulted? Not Applicable   Payton Mccallumonty, Lethia Donlon, MD 12/18/17 2113

## 2018-01-12 ENCOUNTER — Ambulatory Visit
Admission: EM | Admit: 2018-01-12 | Discharge: 2018-01-12 | Disposition: A | Discharge status: Home / Self Care | Payer: Self-pay | Attending: Family Medicine | Admitting: Family Medicine

## 2018-01-12 ENCOUNTER — Ambulatory Visit (INDEPENDENT_AMBULATORY_CARE_PROVIDER_SITE_OTHER)
Admit: 2018-01-12 | Discharge: 2018-01-12 | Disposition: A | Discharge status: Home / Self Care | Payer: Self-pay | Attending: Family Medicine | Admitting: Family Medicine

## 2018-01-12 ENCOUNTER — Encounter: Payer: Self-pay | Admitting: Emergency Medicine

## 2018-01-12 ENCOUNTER — Other Ambulatory Visit: Payer: Self-pay

## 2018-01-12 DIAGNOSIS — R3129 Other microscopic hematuria: Secondary | ICD-10-CM

## 2018-01-12 DIAGNOSIS — S39011A Strain of muscle, fascia and tendon of abdomen, initial encounter: Secondary | ICD-10-CM

## 2018-01-12 DIAGNOSIS — R109 Unspecified abdominal pain: Secondary | ICD-10-CM

## 2018-01-12 LAB — URINALYSIS, COMPLETE (UACMP) WITH MICROSCOPIC
BILIRUBIN URINE: NEGATIVE
Bacteria, UA: NONE SEEN
GLUCOSE, UA: NEGATIVE mg/dL
KETONES UR: NEGATIVE mg/dL
LEUKOCYTES UA: NEGATIVE
Nitrite: NEGATIVE
Protein, ur: NEGATIVE mg/dL
RBC / HPF: >50 RBC/hpf (ref 0–5)
SQUAMOUS EPITHELIAL / LPF: NONE SEEN (ref 0–5)
Specific Gravity, Urine: 1.01 (ref 1.005–1.030)
WBC UA: NONE SEEN WBC/hpf (ref 0–5)
pH: 6.5 (ref 5.0–8.0)

## 2018-01-12 MED ORDER — METAXALONE 800 MG PO TABS: 800.0000 mg | ORAL_TABLET | Freq: Three times a day (TID) | ORAL | 0 refills | Status: DC

## 2018-01-12 NOTE — ED Triage Notes (Signed)
Pt here today c/o flank pain, urinary frequency and urinary retention. He report that his symptoms started about a week ago but have been intermittent. Denies dysuria but says it is hard to urinate. He thinks he may have passed a tiny kidney stone 1-2 weeks ago. He has a history of kidney stones. Denies nausea.

## 2018-01-12 NOTE — ED Provider Notes (Signed)
[facility]    CSN: [id number] Arrival date & time: 01/12/18  1243     History   Chief Complaint Chief Complaint  Patient presents with  . Flank Pain    HPI Zachary Conrad is a 27 y.o. male.   27 yo male with a c/o right flank pain and urinary frequency for the past week intermittently. Patient has a h/o similar prior episodes attributed to presumed kidney stones, however imaging tests have been negative. Denies any fevers, chills. States he also has a h/o back problems and does some heavy work but unsure if this is related to his symptoms.   The history is provided by the patient.  Flank Pain     Past Medical History:  Diagnosis Date  . Kidney stones     There are no active problems to display for this patient.   Past Surgical History:  Procedure Laterality Date  . HAND SURGERY Right   . TONSILLECTOMY         Home Medications    Prior to Admission medications   Medication Sig Start Date End Date Taking? Authorizing Provider  diphenhydrAMINE (BENADRYL) 50 MG capsule Take 50 mg by mouth every 6 (six) hours as needed.   Yes [provider]  ibuprofen (ADVIL,MOTRIN) 800 MG tablet Take 800 mg by mouth every 6 (six) hours as needed for headache, mild pain or moderate pain.   Yes [provider]  HYDROcodone-acetaminophen (NORCO/VICODIN) 5-325 MG tablet 1-2 tabs po bid prn 12/12/17   Payton Mccallumonty, Angelisse Riso, MD  metaxalone (SKELAXIN) 800 MG tablet Take 1 tablet (800 mg total) by mouth 3 (three) times daily. 01/12/18   Payton Mccallumonty, Leyton Magoon, MD  oxyCODONE-acetaminophen (PERCOCET) 5-325 MG tablet Take 1-2 tablets by mouth every 4 (four) hours as needed. 01/19/17   Gilda CreasePollina, Christopher J, MD  promethazine (PHENERGAN) 25 MG tablet Take 1 tablet (25 mg total) by mouth every 6 (six) hours as needed for nausea or vomiting. 01/19/17   Pollina, Canary Brimhristopher J, MD  tamsulosin (FLOMAX) 0.4 MG CAPS capsule Take 1 capsule (0.4 mg total) by mouth daily. 12/12/17   Payton Mccallumonty,  Rontavious Albright, MD  traMADol (ULTRAM) 50 MG tablet Take 1 tablet (50 mg total) by mouth every 6 (six) hours as needed for severe pain. 01/24/17   Charlestine NightLawyer, Christopher, PA-C    Family History Family History  Problem Relation Age of Onset  . Healthy Mother   . Healthy Father     Social History Social History   Tobacco Use  . Smoking status: Never Smoker  . Smokeless tobacco: Current User    Types: Snuff  Substance Use Topics  . Alcohol use: Never    Frequency: Never  . Drug use: No     Allergies   Labetalol; Ondansetron; Azithromycin; Bactrim [sulfamethoxazole-trimethoprim]; Ciprofloxacin; Flexeril [cyclobenzaprine]; Keflex [cephalexin]; and Strawberry extract   Review of Systems Review of Systems  Genitourinary: Positive for flank pain.     Physical Exam Triage Vital Signs ED Triage Vitals  Enc Vitals Group     BP 01/12/18 1258 132/86     Pulse Rate 01/12/18 1258 77     Resp 01/12/18 1258 17     Temp 01/12/18 1258 98 F (36.7 C)     Temp Source 01/12/18 1258 Oral     SpO2 01/12/18 1258 100 %     Weight 01/12/18 1259 183 lb (83 kg)     Height 01/12/18 1259 5\' 10"  (1.778 m)     Head Circumference --  Peak Flow --      Pain Score 01/12/18 1257 7     Pain Loc --      Pain Edu? --      Excl. in GC? --    No data found.  Updated Vital Signs BP 132/86 (BP Location: Left Arm)   Pulse 77   Temp 98 F (36.7 C) (Oral)   Resp 17   Ht 5\' 10"  (1.778 m)   Wt 183 lb (83 kg)   SpO2 100%   BMI 26.26 kg/m   Visual Acuity Right Eye Distance:   Left Eye Distance:   Bilateral Distance:    Right Eye Near:   Left Eye Near:    Bilateral Near:     Physical Exam  Constitutional: He is oriented to person, place, and time. He appears well-developed and well-nourished. No distress.  HENT:  Head: Normocephalic and atraumatic.  Cardiovascular: Normal rate, regular rhythm, normal heart sounds and intact distal pulses.  No murmur heard. Pulmonary/Chest: Effort normal and  breath sounds normal. No respiratory distress. He has no wheezes. He has no rales.  Abdominal: Soft. Bowel sounds are normal. He exhibits no distension and no mass. There is tenderness (mild right flank pain). There is no rebound and no guarding.  Neurological: He is alert and oriented to person, place, and time.  Skin: No rash noted. He is not diaphoretic.  Nursing note and vitals reviewed.    UC Treatments / Results  Labs (all labs ordered are listed, but only abnormal results are displayed) Labs Reviewed  URINALYSIS, COMPLETE (UACMP) WITH MICROSCOPIC - Abnormal; Notable for the following components:      Result Value   APPearance HAZY (*)    Hgb urine dipstick LARGE (*)    All other components within normal limits    EKG None  Radiology US Renal  Result Date: 01/12/2018 CLINICAL DATA:  Right flank pain for 1 week.  Microscopic hematuria. EXAM: RENAL / URINARY TRACT ULTRASOUND COMPLETE COMPARISON:  CT abdomen and pelvis 07/27/2017. FINDINGS: Right Kidney: Length: 10.6 cm. Echogenicity within normal limits. No mass or hydronephrosis visualized. Left Kidney: Length: 11.9 cm. Echogenicity within normal limits. No mass or hydronephrosis visualized. Bladder: Appears normal for degree of bladder distention. IMPRESSION: Negative for hydronephrosis.  Negative exam. Electronically Signed   By: Drusilla Kanner M.D.   On: 01/12/2018 14:25    Procedures Procedures (including critical care time)  Medications Ordered in UC Medications - No data to display  Initial Impression / Assessment and Plan / UC Course  I have reviewed the triage vital signs and the nursing notes.  Pertinent labs & imaging results that were available during my care of the patient were reviewed by me and considered in my medical decision making (see chart for details).      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 Provider   metaxalone (SKELAXIN) 800 MG tablet Take 1 tablet (800 mg total) by mouth 3 (three) times daily. 21 tablet Payton Mccallum, MD     1. Labs/ renal US result and diagnosis reviewed with patient 2. rx as per orders above; reviewed possible side effects, interactions, risks  and benefits  3. Recommend supportive treatment with increase water intake and otc analgesics 4. Discussed with patient, recommend that he follow up with a Urologist for further evaluation of his chronic hematuria.   5. Follow-up prn if symptoms worsen or don't improve   Controlled Substance Prescriptions [place] Controlled Substance Registry consulted? Not Applicable   Payton Mccallum, MD 01/12/18 [id number]

## 2018-01-12 NOTE — Discharge Instructions (Signed)
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

## 2018-01-12 NOTE — ED Triage Notes (Signed)
Scheduled patient for a renal ultrasound at [facility] for today (01/12/18) at 2:00pm.

## 2018-04-21 ENCOUNTER — Ambulatory Visit
Admission: EM | Admit: 2018-04-21 | Discharge: 2018-04-21 | Disposition: A | Discharge status: Home / Self Care | Payer: Self-pay | Attending: Family Medicine | Admitting: Family Medicine

## 2018-04-21 ENCOUNTER — Other Ambulatory Visit: Payer: Self-pay

## 2018-04-21 ENCOUNTER — Encounter: Payer: Self-pay | Admitting: Emergency Medicine

## 2018-04-21 DIAGNOSIS — R059 Cough, unspecified: Secondary | ICD-10-CM

## 2018-04-21 DIAGNOSIS — J40 Bronchitis, not specified as acute or chronic: Secondary | ICD-10-CM

## 2018-04-21 DIAGNOSIS — R05 Cough: Secondary | ICD-10-CM

## 2018-04-21 MED ORDER — DOXYCYCLINE HYCLATE 100 MG PO TABS: 100.0000 mg | ORAL_TABLET | Freq: Two times a day (BID) | ORAL | 0 refills | Status: DC

## 2018-04-21 MED ORDER — HYDROCOD POLST-CPM POLST ER 10-8 MG/5ML PO SUER: 5.0000 mL | Freq: Two times a day (BID) | ORAL | 0 refills | Status: DC | PRN

## 2018-04-21 NOTE — ED Triage Notes (Signed)
Pt c/o cough, and "left lung fullness". Cough is keeping him up at night. Started about a week ago.

## 2018-04-21 NOTE — ED Provider Notes (Signed)
[facility]    CSN: [id number] Arrival date & time: 04/21/18  1817     History   Chief Complaint Chief Complaint  Patient presents with  . Cough    HPI Zachary Conrad is a 27 y.o. male.   The history is provided by the patient.  Cough  Associated symptoms: rhinorrhea   Associated symptoms: no wheezing   URI  Presenting symptoms: congestion, cough and rhinorrhea   Severity:  Moderate Onset quality:  Sudden Duration:  10 days Timing:  Constant Progression:  Worsening Chronicity:  New Relieved by:  Nothing Ineffective treatments:  OTC medications Associated symptoms: sinus pain   Associated symptoms: no wheezing   Risk factors: sick contacts   Risk factors: not elderly, no chronic cardiac disease, no chronic kidney disease, no chronic respiratory disease, no diabetes mellitus, no immunosuppression, no recent illness and no recent travel     Past Medical History:  Diagnosis Date  . Kidney stones     There are no active problems to display for this patient.   Past Surgical History:  Procedure Laterality Date  . HAND SURGERY Right   . TONSILLECTOMY         Home Medications    Prior to Admission medications   Medication Sig Start Date End Date Taking? Authorizing Provider  cetirizine (ZYRTEC) 10 MG chewable tablet Chew 10 mg by mouth daily.   Yes [provider]  ibuprofen (ADVIL,MOTRIN) 800 MG tablet Take 800 mg by mouth every 6 (six) hours as needed for headache, mild pain or moderate pain.   Yes [provider]  chlorpheniramine-HYDROcodone (TUSSIONEX PENNKINETIC ER) 10-8 MG/5ML SUER Take 5 mLs by mouth every 12 (twelve) hours as needed. 04/21/18   Payton Mccallum, MD  diphenhydrAMINE (BENADRYL) 50 MG capsule Take 50 mg by mouth every 6 (six) hours as needed.    [provider]  doxycycline (VIBRA-TABS) 100 MG tablet Take 1 tablet (100 mg total) by mouth 2 (two) times daily. 04/21/18   Payton Mccallum, MD    HYDROcodone-acetaminophen (NORCO/VICODIN) 5-325 MG tablet 1-2 tabs po bid prn 12/12/17   Payton Mccallum, MD  metaxalone (SKELAXIN) 800 MG tablet Take 1 tablet (800 mg total) by mouth 3 (three) times daily. 01/12/18   Payton Mccallum, MD  oxyCODONE-acetaminophen (PERCOCET) 5-325 MG tablet Take 1-2 tablets by mouth every 4 (four) hours as needed. 01/19/17   Gilda Crease, MD  promethazine (PHENERGAN) 25 MG tablet Take 1 tablet (25 mg total) by mouth every 6 (six) hours as needed for nausea or vomiting. 01/19/17   Pollina, Canary Brim, MD  tamsulosin (FLOMAX) 0.4 MG CAPS capsule Take 1 capsule (0.4 mg total) by mouth daily. 12/12/17   Payton Mccallum, MD  traMADol (ULTRAM) 50 MG tablet Take 1 tablet (50 mg total) by mouth every 6 (six) hours as needed for severe pain. 01/24/17   Charlestine Night, PA-C    Family History Family History  Problem Relation Age of Onset  . Healthy Mother   . Healthy Father     Social History Social History   Tobacco Use  . Smoking status: Never Smoker  . Smokeless tobacco: Current User    Types: Snuff  Substance Use Topics  . Alcohol use: Never    Frequency: Never  . Drug use: No     Allergies   Labetalol; Ondansetron; Azithromycin; Bactrim [sulfamethoxazole-trimethoprim]; Ciprofloxacin; Flexeril [cyclobenzaprine]; Keflex [cephalexin]; and Strawberry extract   Review of Systems Review of Systems  HENT: Positive for congestion,  rhinorrhea and sinus pain.   Respiratory: Positive for cough. Negative for wheezing.      Physical Exam Triage Vital Signs ED Triage Vitals  Enc Vitals Group     BP 04/21/18 1930 139/80     Pulse Rate 04/21/18 1930 88     Resp 04/21/18 1930 17     Temp 04/21/18 1930 98.3 F (36.8 C)     Temp Source 04/21/18 1930 Oral     SpO2 04/21/18 1930 99 %     Weight 04/21/18 1927 200 lb (90.7 kg)     Height 04/21/18 1927 5\' 10"  (1.778 m)     Head Circumference --      Peak Flow --      Pain Score 04/21/18 1926 5      Pain Loc --      Pain Edu? --      Excl. in GC? --    No data found.  Updated Vital Signs BP 139/80 (BP Location: Left Arm)   Pulse 88   Temp 98.3 F (36.8 C) (Oral)   Resp 17   Ht 5\' 10"  (1.778 m)   Wt 90.7 kg   SpO2 99%   BMI 28.70 kg/m   Visual Acuity Right Eye Distance:   Left Eye Distance:   Bilateral Distance:    Right Eye Near:   Left Eye Near:    Bilateral Near:     Physical Exam  Constitutional: He appears well-developed and well-nourished. No distress.  HENT:  Head: Normocephalic and atraumatic.  Nose: Nose normal.  Mouth/Throat: Uvula is midline and mucous membranes are normal. Posterior oropharyngeal erythema present. No oropharyngeal exudate, posterior oropharyngeal edema or tonsillar abscesses. No tonsillar exudate.  Eyes: No scleral icterus.  Neck: Normal range of motion. Neck supple. No tracheal deviation present. No thyromegaly present.  Cardiovascular: Normal rate, regular rhythm and normal heart sounds.  Pulmonary/Chest: Effort normal. No stridor. No respiratory distress. He has no wheezes. He has no rales. He exhibits no tenderness.  Diffuse rhonchi  Lymphadenopathy:    He has no cervical adenopathy.  Neurological: He is alert.  Skin: Skin is warm and dry. He is not diaphoretic.  Nursing note and vitals reviewed.    UC Treatments / Results  Labs (all labs ordered are listed, but only abnormal results are displayed) Labs Reviewed - No data to display  EKG None  Radiology No results found.  Procedures Procedures (including critical care time)  Medications Ordered in UC Medications - No data to display  Initial Impression / Assessment and Plan / UC Course  I have reviewed the triage vital signs and the nursing notes.  Pertinent labs & imaging results that were available during my care of the patient were reviewed by me and considered in my medical decision making (see chart for details).      Final Clinical Impressions(s) / UC  Diagnoses   Final diagnoses:  Bronchitis  Cough   Discharge Instructions   None    ED Prescriptions    Medication Sig Dispense Auth. Provider   doxycycline (VIBRA-TABS) 100 MG tablet Take 1 tablet (100 mg total) by mouth 2 (two) times daily. 20 tablet Payton Mccallum, MD   chlorpheniramine-HYDROcodone [facility] ER) 10-8 MG/5ML SUER Take 5 mLs by mouth every 12 (twelve) hours as needed. 60 mL Payton Mccallum, MD      1. diagnosis reviewed with patient 2. rx as per orders above; reviewed possible side effects, interactions, risks and benefits  3. Recommend  supportive treatment with rest, fluids 4. Follow-up prn if symptoms worsen or don't improve   Controlled Substance Prescriptions [place] Controlled Substance Registry consulted? Not Applicable   Payton Mccallum, MD 04/21/18 2013

## 2018-04-29 ENCOUNTER — Emergency Department: Payer: Self-pay

## 2018-04-29 ENCOUNTER — Encounter: Payer: Self-pay | Admitting: Emergency Medicine

## 2018-04-29 ENCOUNTER — Emergency Department
Admission: EM | Admit: 2018-04-29 | Discharge: 2018-04-30 | Disposition: A | Discharge status: Home / Self Care | Payer: Self-pay | Attending: Student in an Organized Health Care Education/Training Program | Admitting: Student in an Organized Health Care Education/Training Program

## 2018-04-29 ENCOUNTER — Other Ambulatory Visit: Payer: Self-pay

## 2018-04-29 DIAGNOSIS — Y9321 Activity, ice skating: Secondary | ICD-10-CM | POA: Insufficient documentation

## 2018-04-29 DIAGNOSIS — S060X0A Concussion without loss of consciousness, initial encounter: Secondary | ICD-10-CM | POA: Insufficient documentation

## 2018-04-29 DIAGNOSIS — J40 Bronchitis, not specified as acute or chronic: Secondary | ICD-10-CM | POA: Insufficient documentation

## 2018-04-29 DIAGNOSIS — Y929 Unspecified place or not applicable: Secondary | ICD-10-CM | POA: Insufficient documentation

## 2018-04-29 DIAGNOSIS — Z79899 Other long term (current) drug therapy: Secondary | ICD-10-CM | POA: Insufficient documentation

## 2018-04-29 DIAGNOSIS — Y999 Unspecified external cause status: Secondary | ICD-10-CM | POA: Insufficient documentation

## 2018-04-29 MED ORDER — PREDNISONE 10 MG PO TABS: ORAL_TABLET | ORAL | 0 refills | Status: DC

## 2018-04-29 MED ORDER — IBUPROFEN 600 MG PO TABS: 600.0000 mg | ORAL_TABLET | Freq: Four times a day (QID) | ORAL | 0 refills | Status: AC | PRN

## 2018-04-29 MED ORDER — PROMETHAZINE HCL 25 MG/ML IJ SOLN
12.5000 mg | Freq: Once | INTRAMUSCULAR | Status: AC
  Administered 2018-04-29: 12.5 mg via INTRAVENOUS
  Filled 2018-04-29: qty 1

## 2018-04-29 MED ORDER — BENZONATATE 100 MG PO CAPS: 100.0000 mg | ORAL_CAPSULE | Freq: Three times a day (TID) | ORAL | 0 refills | Status: DC | PRN

## 2018-04-29 MED ORDER — KETOROLAC TROMETHAMINE 30 MG/ML IJ SOLN
30.0000 mg | Freq: Once | INTRAMUSCULAR | Status: AC
  Administered 2018-04-29: 30 mg via INTRAVENOUS
  Filled 2018-04-29: qty 1

## 2018-04-29 MED ORDER — LIDOCAINE VISCOUS HCL 2 % MT SOLN
15.0000 mL | Freq: Once | OROMUCOSAL | Status: AC
  Administered 2018-04-29: 15 mL via OROMUCOSAL
  Filled 2018-04-29: qty 15

## 2018-04-29 MED ORDER — PROMETHAZINE HCL 25 MG PO TABS: 25.0000 mg | ORAL_TABLET | Freq: Four times a day (QID) | ORAL | 0 refills | Status: DC | PRN

## 2018-04-29 MED ORDER — BACLOFEN 10 MG PO TABS: 10.0000 mg | ORAL_TABLET | Freq: Every day | ORAL | 0 refills | Status: DC

## 2018-04-29 MED ORDER — SODIUM CHLORIDE 0.9 % IV BOLUS
1000.0000 mL | Freq: Once | INTRAVENOUS | Status: AC
  Administered 2018-04-29: 1000 mL via INTRAVENOUS

## 2018-04-29 NOTE — ED Provider Notes (Signed)
[facility] Emergency Department Provider Note  ____________________________________________  Time seen: Approximately 7:26 PM  I have reviewed the triage vital signs and the nursing notes.   HISTORY  Chief Complaint Fall    HPI Zachary Conrad is a 27 y.o. male presents emergency department for evaluation of headache and shoulder pain after head injury yesterday.  Injury happened around 2:30pm. Patient was ice skating, when he fell and hit the back of his head.  Headache is primarily in the back.  He did not lose consciousness.  He vomited once last night.  He vomited 3 times today.  He has not taken anything for pain.  He has never had a concussion before.   Past Medical History:  Diagnosis Date  . Kidney stones     There are no active problems to display for this patient.   Past Surgical History:  Procedure Laterality Date  . HAND SURGERY Right   . TONSILLECTOMY      Prior to Admission medications   Medication Sig Start Date End Date Taking? Authorizing Provider  baclofen (LIORESAL) 10 MG tablet Take 1 tablet (10 mg total) by mouth daily. 04/29/18 04/29/19  Enid Derry, PA-C  benzonatate (TESSALON PERLES) 100 MG capsule Take 1 capsule (100 mg total) by mouth 3 (three) times daily as needed for cough. 04/29/18 04/29/19  Enid Derry, PA-C  cetirizine (ZYRTEC) 10 MG chewable tablet Chew 10 mg by mouth daily.    [provider]  chlorpheniramine-HYDROcodone (TUSSIONEX PENNKINETIC ER) 10-8 MG/5ML SUER Take 5 mLs by mouth every 12 (twelve) hours as needed. 04/21/18   Payton Mccallum, MD  diphenhydrAMINE (BENADRYL) 50 MG capsule Take 50 mg by mouth every 6 (six) hours as needed.    [provider]  doxycycline (VIBRA-TABS) 100 MG tablet Take 1 tablet (100 mg total) by mouth 2 (two) times daily. 04/21/18   Payton Mccallum, MD  HYDROcodone-acetaminophen (NORCO/VICODIN) 5-325 MG tablet 1-2 tabs po bid prn 12/12/17   Payton Mccallum, MD   ibuprofen (ADVIL,MOTRIN) 600 MG tablet Take 1 tablet (600 mg total) by mouth every 6 (six) hours as needed. 04/29/18   Enid Derry, PA-C  metaxalone (SKELAXIN) 800 MG tablet Take 1 tablet (800 mg total) by mouth 3 (three) times daily. 01/12/18   Payton Mccallum, MD  oxyCODONE-acetaminophen (PERCOCET) 5-325 MG tablet Take 1-2 tablets by mouth every 4 (four) hours as needed. 01/19/17   Pollina, Canary Brim, MD  predniSONE (DELTASONE) 10 MG tablet Take 6 tablets on day 1, take 5 tablets on day 2, take 4 tablets on day 3, take 3 tablets on day 4, take 2 tablets on day 5, take 1 tablet on day 6 04/29/18   Enid Derry, PA-C  promethazine (PHENERGAN) 25 MG tablet Take 1 tablet (25 mg total) by mouth every 6 (six) hours as needed for nausea or vomiting. 04/29/18   Enid Derry, PA-C  tamsulosin (FLOMAX) 0.4 MG CAPS capsule Take 1 capsule (0.4 mg total) by mouth daily. 12/12/17   Payton Mccallum, MD  traMADol (ULTRAM) 50 MG tablet Take 1 tablet (50 mg total) by mouth every 6 (six) hours as needed for severe pain. 01/24/17   Lawyer, Cristal Deer, PA-C    Allergies Labetalol; Ondansetron; Azithromycin; Bactrim [sulfamethoxazole-trimethoprim]; Ciprofloxacin; Flexeril [cyclobenzaprine]; Keflex [cephalexin]; and Strawberry extract  Family History  Problem Relation Age of Onset  . Healthy Mother   . Healthy Father     Social History Social History   Tobacco Use  . Smoking status: Never Smoker  .  Smokeless tobacco: Current User    Types: Snuff  Substance Use Topics  . Alcohol use: Never    Frequency: Never  . Drug use: No     Review of Systems  Cardiovascular: No chest pain. Respiratory: No SOB. Gastrointestinal: No abdominal pain.  No nausea, no vomiting.  Musculoskeletal: Positive shoulder pain. Skin: Negative for rash, abrasions, lacerations, ecchymosis. Neurological: Negative for numbness or tingling.  Positive for headache.   ____________________________________________   PHYSICAL  EXAM:  VITAL SIGNS: ED Triage Vitals  Enc Vitals Group     BP 04/29/18 1805 137/75     Pulse Rate 04/29/18 1805 (!) 102     Resp 04/29/18 1805 18     Temp 04/29/18 1805 98.2 F (36.8 C)     Temp Source 04/29/18 1805 Oral     SpO2 04/29/18 1805 99 %     Weight 04/29/18 1806 200 lb (90.7 kg)     Height 04/29/18 1806 5\' 10"  (1.778 m)     Head Circumference --      Peak Flow --      Pain Score 04/29/18 1805 10     Pain Loc --      Pain Edu? --      Excl. in GC? --      Constitutional: Alert and oriented. Well appearing and in no acute distress. Eyes: Conjunctivae are normal. PERRL. EOMI. Head: Atraumatic. ENT:      Ears:      Nose: No congestion/rhinnorhea.      Mouth/Throat: Mucous membranes are moist.  Neck: No stridor. No cervical spine tenderness to palpation. Cardiovascular: Normal rate, regular rhythm.  Good peripheral circulation. Respiratory: Normal respiratory effort without tachypnea or retractions. Lungs CTAB. Good air entry to the bases with no decreased or absent breath sounds. Gastrointestinal: Bowel sounds 4 quadrants. Soft and nontender to palpation. No guarding or rigidity. No palpable masses. No distention Musculoskeletal: Full range of motion to all extremities. No gross deformities appreciated.  Tenderness to palpation between neck and left shoulder.  Pain elicited with range of motion of shoulder. Neurologic: Normal speech and language. No gross focal neurologic deficits are appreciated.  Cranial nerves: 2-10 normal as tested. Strength 5/5 in upper and lower extremities Cerebellar: Finger-nose-finger WNL, Heel to shin WNL Sensorimotor: No pronator drift, clonus, sensory loss or abnormal reflexes. No vision deficits noted to confrontation bilaterally.  Speech: No dysarthria or expressive aphasia Skin:  Skin is warm, dry and intact. No rash noted. Psychiatric: Mood and affect are normal. Speech and behavior are normal. Patient exhibits appropriate insight and  judgement.   ____________________________________________   LABS (all labs ordered are listed, but only abnormal results are displayed)  Labs Reviewed - No data to display ____________________________________________  EKG   ____________________________________________  RADIOLOGY Lexine Baton, personally viewed and evaluated these images (plain radiographs) as part of my medical decision making, as well as reviewing the written report by the radiologist.  Dg Chest 2 View  Result Date: 04/29/2018 CLINICAL DATA:  Cough for 2 weeks.  Mid chest pain.  Fall. EXAM: CHEST - 2 VIEW COMPARISON:  None. FINDINGS: The cardiomediastinal contours are normal. The lungs are clear. Pulmonary vasculature is normal. No consolidation, pleural effusion, or pneumothorax. No acute osseous abnormalities are seen. IMPRESSION: Negative radiographs of the chest. Electronically Signed   By: Narda Rutherford M.D.   On: 04/29/2018 22:24   Dg Cervical Spine 2-3 Views  Result Date: 04/29/2018 CLINICAL DATA:  Pain after fall EXAM: CERVICAL  SPINE - 2-3 VIEW COMPARISON:  None. FINDINGS: There is no evidence of cervical spine fracture or prevertebral soft tissue swelling. Alignment is normal. No other significant bone abnormalities are identified. IMPRESSION: Negative cervical spine radiographs. Electronically Signed   By: Gerome Sam III M.D   On: 04/29/2018 20:04   Ct Head Wo Contrast  Result Date: 04/29/2018 CLINICAL DATA:  27 year old male with headache following fall and head injury yesterday. EXAM: CT HEAD WITHOUT CONTRAST TECHNIQUE: Contiguous axial images were obtained from the base of the skull through the vertex without intravenous contrast. COMPARISON:  None. FINDINGS: Brain: No evidence of acute infarction, hemorrhage, hydrocephalus, extra-axial collection or mass lesion/mass effect. Vascular: No hyperdense vessel or unexpected calcification. Skull: Normal. Negative for fracture or focal lesion.  Sinuses/Orbits: No acute finding. Other: None. IMPRESSION: Unremarkable noncontrast head CT. Electronically Signed   By: Harmon Pier M.D.   On: 04/29/2018 18:56   Dg Shoulder Left  Result Date: 04/29/2018 CLINICAL DATA:  Fall while skating. EXAM: LEFT SHOULDER - 2+ VIEW COMPARISON:  None. FINDINGS: There is no evidence of fracture or dislocation. There is no evidence of arthropathy or other focal bone abnormality. Soft tissues are unremarkable. IMPRESSION: Negative. Electronically Signed   By: Gerome Sam III M.D   On: 04/29/2018 20:03    ____________________________________________    PROCEDURES  Procedure(s) performed:    Procedures    Medications  ketorolac (TORADOL) 30 MG/ML injection 30 mg (30 mg Intravenous Given 04/29/18 2027)  promethazine (PHENERGAN) injection 12.5 mg (12.5 mg Intravenous Given 04/29/18 2028)  sodium chloride 0.9 % bolus 1,000 mL (0 mLs Intravenous Stopped 04/29/18 2234)  lidocaine (XYLOCAINE) 2 % viscous mouth solution 15 mL (15 mLs Mouth/Throat Given 04/29/18 2230)     ____________________________________________   INITIAL IMPRESSION / ASSESSMENT AND PLAN / ED COURSE  Pertinent labs & imaging results that were available during my care of the patient were reviewed by me and considered in my medical decision making (see chart for details).  Review of the [place] CSRS was performed in accordance of the [facility] prior to dispensing any controlled drugs.     Patient's diagnosis is consistent with concussion and bronchitis.  Vital signs and exam are reassuring.  Head CT is negative for acute intracranial abnormality.  Neck x-ray and shoulder x-ray are negative.  Patient had an episode of vomiting in the emergency department.  IV fluids, Phenergan, Toradol was given for headache.  After initial assessment, patient states that he is being treated for bronchitis with doxycycline.  Cough has not improved.  Chest x-ray was ordered.  Chest x-ray negative for acute  cardiopulmonary processes.  Patient will be discharged home with prescriptions for prednisone, Tessalon Perles for cough and Motrin, baclofen for shoulder pain and Phenergan for nausea. Patient is to follow up with neurology and primary care as directed. Patient is given ED precautions to return to the ED for any worsening or new symptoms.     ____________________________________________  FINAL CLINICAL IMPRESSION(S) / ED DIAGNOSES  Final diagnoses:  Concussion without loss of consciousness, initial encounter  Bronchitis      NEW MEDICATIONS STARTED DURING THIS VISIT:  ED Discharge Orders         Ordered    predniSONE (DELTASONE) 10 MG tablet     04/29/18 2249    benzonatate (TESSALON PERLES) 100 MG capsule  3 times daily PRN     04/29/18 2249    ibuprofen (ADVIL,MOTRIN) 600 MG tablet  Every 6 hours PRN  04/29/18 2249    baclofen (LIORESAL) 10 MG tablet  Daily     04/29/18 2249    promethazine (PHENERGAN) 25 MG tablet  Every 6 hours PRN     04/29/18 2249              This chart was dictated using voice recognition software/Dragon. Despite best efforts to proofread, errors can occur which can change the meaning. Any change was purely unintentional.    Enid Derry, PA-C 04/29/18 2356    Willy Eddy, MD 04/30/18 Moses Manners

## 2018-04-29 NOTE — ED Triage Notes (Signed)
Pt arrived via POV with reports of fall while ice skating yesterday, pt states he did not lose consciousness, but states things went quiet for a brief moment.  Pt states he hit the back of his head and has had a headache since the injury. Pt reports he has been vomiting off and on since yesterday. Pt states today when he moves his shoulder a certain way it makes him nauseated and vomit.  Denies any photosensitivity today but reports yesterday he did have photosensitivity.

## 2018-04-29 NOTE — ED Notes (Signed)
See triage note  States he fell last pm while ice skating   Having pain to back of head,neck and posterior shoulder  States he had some blurred vision last pm with some nausea

## 2018-04-29 NOTE — ED Notes (Signed)
D/w Dr. Roxan Hockey, new order received for CT head.  Pt is not actively vomiting in triage at this time.  Pt is ambulatory without difficulty.

## 2018-05-27 ENCOUNTER — Encounter: Payer: Self-pay | Admitting: Emergency Medicine

## 2018-05-27 ENCOUNTER — Ambulatory Visit
Admission: EM | Admit: 2018-05-27 | Discharge: 2018-05-27 | Disposition: A | Discharge status: Home / Self Care | Payer: Self-pay | Attending: Family Medicine | Admitting: Family Medicine

## 2018-05-27 ENCOUNTER — Other Ambulatory Visit: Payer: Self-pay

## 2018-05-27 DIAGNOSIS — J209 Acute bronchitis, unspecified: Secondary | ICD-10-CM

## 2018-05-27 LAB — RAPID STREP SCREEN (MED CTR MEBANE ONLY): Streptococcus, Group A Screen (Direct): NEGATIVE

## 2018-05-27 MED ORDER — DOXYCYCLINE HYCLATE 100 MG PO CAPS: 100.0000 mg | ORAL_CAPSULE | Freq: Two times a day (BID) | ORAL | 0 refills | Status: DC

## 2018-05-27 MED ORDER — HYDROCOD POLST-CPM POLST ER 10-8 MG/5ML PO SUER: 5.0000 mL | Freq: Every evening | ORAL | 0 refills | Status: DC | PRN

## 2018-05-27 MED ORDER — PREDNISONE 50 MG PO TABS: ORAL_TABLET | ORAL | 0 refills | Status: DC

## 2018-05-27 NOTE — ED Triage Notes (Signed)
Patient c/o productive cough and congestion that started 2 weeks ago. States the cough is worse at night. Patient has tried Afrin, Advil, Albuterol and some old antibiotics.

## 2018-05-27 NOTE — ED Provider Notes (Signed)
[facility]    CSN: [id number] Arrival date & time: 05/27/18  1622  History   Chief Complaint Chief Complaint  Patient presents with  . Cough   HPI  27 year old male presents with respiratory symptoms.  Patient reports he has been sick for the past 2 weeks.  Reports cough and congestion.  Associated sore throat.  Reports fatigue.  Subjective fever and chills.  Symptoms are severe.  Has been using Advil, Afrin, albuterol without resolution.  No known exacerbating factors.  No other associated symptoms.  No other complaints.  PMH, Surgical Hx, Family Hx, Social History reviewed and updated as below.  Past Medical History:  Diagnosis Date  . Kidney stones    Past Surgical History:  Procedure Laterality Date  . HAND SURGERY Right   . TONSILLECTOMY     Family History Family History  Problem Relation Age of Onset  . Healthy Mother   . Healthy Father     Social History Social History   Tobacco Use  . Smoking status: Never Smoker  . Smokeless tobacco: Current User    Types: Snuff  Substance Use Topics  . Alcohol use: Never    Frequency: Never  . Drug use: No     Allergies   Labetalol; Ondansetron; Azithromycin; Bactrim [sulfamethoxazole-trimethoprim]; Ciprofloxacin; Flexeril [cyclobenzaprine]; Keflex [cephalexin]; and Strawberry extract   Review of Systems Review of Systems  Constitutional: Positive for chills and fever.  HENT: Positive for congestion.   Respiratory: Positive for cough.    Physical Exam Triage Vital Signs ED Triage Vitals  Enc Vitals Group     BP 05/27/18 1642 126/70     Pulse Rate 05/27/18 1642 86     Resp 05/27/18 1642 18     Temp 05/27/18 1642 98.2 F (36.8 C)     Temp Source 05/27/18 1642 Oral     SpO2 05/27/18 1642 99 %     Weight 05/27/18 1638 191 lb (86.6 kg)     Height 05/27/18 1638 5\' 10"  (1.778 m)     Head Circumference --      Peak Flow --      Pain Score 05/27/18 1638 5     Pain Loc --      Pain Edu? --      Excl. in GC? --    Updated Vital Signs BP 126/70 (BP Location: Left Arm)   Pulse 86   Temp 98.2 F (36.8 C) (Oral)   Resp 18   Ht 5\' 10"  (1.778 m)   Wt 86.6 kg   SpO2 99%   BMI 27.41 kg/m   Visual Acuity Right Eye Distance:   Left Eye Distance:   Bilateral Distance:    Right Eye Near:   Left Eye Near:    Bilateral Near:     Physical Exam  Constitutional: He is oriented to person, place, and time. He appears well-developed. No distress.  HENT:  Head: Normocephalic and atraumatic.  Mouth/Throat: Oropharynx is clear and moist.  Cardiovascular: Normal rate and regular rhythm.  Pulmonary/Chest: Effort normal and breath sounds normal. He has no wheezes. He has no rales.  Neurological: He is alert and oriented to person, place, and time.  Psychiatric: He has a normal mood and affect. His behavior is normal.  Nursing note and vitals reviewed.  UC Treatments / Results  Labs (all labs ordered are listed, but only abnormal results are displayed) Labs Reviewed  RAPID STREP SCREEN ([facility] ONLY)  CULTURE, GROUP A STREP [facility])  EKG None  Radiology No results found.  Procedures Procedures (including critical care time)  Medications Ordered in UC Medications - No data to display  Initial Impression / Assessment and Plan / UC Course  I have reviewed the triage vital signs and the nursing notes.  Pertinent labs & imaging results that were available during my care of the patient were reviewed by me and considered in my medical decision making (see chart for details).    27 year old male presents with acute bronchitis.  Given duration of illness, treating with doxycycline.  Prednisone and Tussionex as well.  Final Clinical Impressions(s) / UC Diagnoses   Final diagnoses:  Acute bronchitis, unspecified organism   Discharge Instructions   None    ED Prescriptions    Medication Sig Dispense Auth. Provider   doxycycline (VIBRAMYCIN) 100 MG capsule Take 1  capsule (100 mg total) by mouth 2 (two) times daily. 14 capsule Meital Riehl G, DO   predniSONE (DELTASONE) 50 MG tablet 1 tablet daily x 5 days 5 tablet Thurma Priego G, DO   chlorpheniramine-HYDROcodone (TUSSIONEX PENNKINETIC ER) 10-8 MG/5ML SUER Take 5 mLs by mouth at bedtime as needed. 60 mL Tommie Sams, DO     Controlled Substance Prescriptions [place] Controlled Substance Registry consulted? Not Applicable   Tommie Sams, DO 05/27/18 4098

## 2018-05-30 LAB — CULTURE, GROUP A STREP (THRC)

## 2018-06-08 ENCOUNTER — Encounter: Payer: Self-pay | Admitting: Emergency Medicine

## 2018-06-08 ENCOUNTER — Other Ambulatory Visit: Payer: Self-pay

## 2018-06-08 ENCOUNTER — Ambulatory Visit
Admission: EM | Admit: 2018-06-08 | Discharge: 2018-06-08 | Disposition: A | Discharge status: Home / Self Care | Payer: Self-pay | Attending: Family Medicine | Admitting: Family Medicine

## 2018-06-08 DIAGNOSIS — Y26XXXA Exposure to smoke, fire and flames, undetermined intent, initial encounter: Secondary | ICD-10-CM

## 2018-06-08 DIAGNOSIS — T310 Burns involving less than 10% of body surface: Secondary | ICD-10-CM

## 2018-06-08 MED ORDER — SILVER SULFADIAZINE 1 % EX CREA: 1.0000 "application " | TOPICAL_CREAM | Freq: Every day | CUTANEOUS | 0 refills | Status: DC

## 2018-06-08 MED ORDER — IBUPROFEN 400 MG PO TABS
400.0000 mg | ORAL_TABLET | ORAL | Status: AC
  Administered 2018-06-08: 400 mg via ORAL

## 2018-06-08 MED ORDER — ACETAMINOPHEN 500 MG PO TABS: 500.0000 mg | ORAL_TABLET | Freq: Once | ORAL | Status: DC

## 2018-06-08 MED ORDER — ACETAMINOPHEN 500 MG PO TABS
500.0000 mg | ORAL_TABLET | Freq: Once | ORAL | Status: AC
  Administered 2018-06-08: 500 mg via ORAL
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
## 2018-06-08 NOTE — Discharge Instructions (Signed)
Keep as clean as possible.  Wear gloves at work to protect the burn.  Pain take Tylenol 500 mg combined with ibuprofen 400 mg every 6 hours.  If any problems [facility] for further follow-up
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
## 2018-07-26 ENCOUNTER — Ambulatory Visit
Admission: EM | Admit: 2018-07-26 | Discharge: 2018-07-26 | Disposition: A | Discharge status: Home / Self Care | Payer: Self-pay | Attending: Family Medicine | Admitting: Family Medicine

## 2018-07-26 ENCOUNTER — Other Ambulatory Visit: Payer: Self-pay

## 2018-07-26 ENCOUNTER — Encounter: Payer: Self-pay | Admitting: Emergency Medicine

## 2018-07-26 ENCOUNTER — Ambulatory Visit (INDEPENDENT_AMBULATORY_CARE_PROVIDER_SITE_OTHER): Payer: Self-pay

## 2018-07-26 DIAGNOSIS — S39012A Strain of muscle, fascia and tendon of lower back, initial encounter: Secondary | ICD-10-CM | POA: Insufficient documentation

## 2018-07-26 DIAGNOSIS — M545 Low back pain: Secondary | ICD-10-CM

## 2018-07-26 DIAGNOSIS — M5416 Radiculopathy, lumbar region: Secondary | ICD-10-CM | POA: Insufficient documentation

## 2018-07-26 DIAGNOSIS — S300XXA Contusion of lower back and pelvis, initial encounter: Secondary | ICD-10-CM | POA: Insufficient documentation

## 2018-07-26 MED ORDER — KETOROLAC TROMETHAMINE 60 MG/2ML IM SOLN
60.0000 mg | Freq: Once | INTRAMUSCULAR | Status: AC
  Administered 2018-07-26: 60 mg via INTRAMUSCULAR

## 2018-07-26 MED ORDER — MELOXICAM 15 MG PO TABS: 15.0000 mg | ORAL_TABLET | Freq: Every day | ORAL | 0 refills | Status: DC

## 2018-07-26 MED ORDER — METAXALONE 800 MG PO TABS: 800.0000 mg | ORAL_TABLET | Freq: Three times a day (TID) | ORAL | 0 refills | Status: DC

## 2018-07-26 NOTE — Discharge Instructions (Signed)
Apply ice 20 minutes out of every 2 hours 4-5 times daily for comfort.  °
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
## 2018-07-26 NOTE — ED Provider Notes (Signed)
[facility]    CSN: [id number] Arrival date & time: 07/26/18  1704     History   Chief Complaint Chief Complaint  Patient presents with  . Back Pain    lower    HPI Zachary Conrad is a 28 y.o. male.   HPI  28 year old male presents with lower back pain with radiation into his right posterior thigh to about the level of his mid thigh.  He states that he fell while skateboarding around 2 days ago.  It is going up a ramp the board slipped out from underneath him and he fell backwards onto his buttocks.  First did not seem to bother him but after a day or so began to have more severe pain.  Any incontinence.  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  Have a limp when he is walking.        Past Medical History:  Diagnosis Date  . Kidney stones     There are no active problems to display for this patient.   Past Surgical History:  Procedure Laterality Date  . HAND SURGERY Right   . TONSILLECTOMY         Home Medications    Prior to Admission medications   Medication Sig Start Date End Date Taking? Authorizing Provider  cetirizine (ZYRTEC) 10 MG chewable tablet Chew 10 mg by mouth daily.   Yes [provider]  ibuprofen (ADVIL,MOTRIN) 600 MG tablet Take 1 tablet (600 mg total) by mouth every 6 (six) hours as needed. 04/29/18  Yes Enid DerryWagner, Ashley, PA-C  ranitidine (ZANTAC) 150 MG tablet Take 150 mg by mouth 2 (two) times daily.   Yes [provider]  meloxicam (MOBIC) 15 MG tablet Take 1 tablet (15 mg total) by mouth daily. 07/26/18   Lutricia Feiloemer, Tess Potts P, PA-C  metaxalone (SKELAXIN) 800 MG tablet Take 1 tablet (800 mg total) by mouth 3 (three) times daily. 07/26/18   Lutricia Feiloemer, Johnwilliam Shepperson P, PA-C    Family History Family History  Problem Relation Age of Onset  . Bipolar disorder Mother   . Healthy Father     Social History Social History   Tobacco Use  .  Smoking status: Never Smoker  . Smokeless tobacco: Current User    Types: Snuff  Substance Use Topics  . Alcohol use: Yes    Frequency: Never    Comment: rarely  . Drug use: No     Allergies   Labetalol; Ondansetron; Azithromycin; Bactrim [sulfamethoxazole-trimethoprim]; Ciprofloxacin; Flexeril [cyclobenzaprine]; Keflex [cephalexin]; and Strawberry extract   Review of Systems Review of Systems  Constitutional: Positive for activity change. Negative for appetite change, chills, fatigue and fever.  Musculoskeletal: Positive for back pain, gait problem and myalgias.  All other systems reviewed and are negative.    Physical Exam Triage Vital Signs ED Triage Vitals  Enc Vitals Group     BP 07/26/18 1722 (!) 146/85     Pulse Rate 07/26/18 1722 63     Resp 07/26/18 1722 18     Temp 07/26/18 1722 98.6 F (37 C)     Temp Source 07/26/18 1722 Oral     SpO2 07/26/18 1722 100 %     Weight 07/26/18 1721 190 lb (86.2 kg)     Height 07/26/18 1721 5\' 10"  (1.778 m)     Head Circumference --      Peak Flow --  Pain Score 07/26/18 1721 8     Pain Loc --      Pain Edu? --      Excl. in GC? --    No data found.  Updated Vital Signs BP (!) 146/85 (BP Location: Left Arm)   Pulse 63   Temp 98.6 F (37 C) (Oral)   Resp 18   Ht 5\' 10"  (1.778 m)   Wt 190 lb (86.2 kg)   SpO2 100%   BMI 27.26 kg/m   Visual Acuity Right Eye Distance:   Left Eye Distance:   Bilateral Distance:    Right Eye Near:   Left Eye Near:    Bilateral Near:     Physical Exam Vitals signs and nursing note reviewed.  Constitutional:      General: He is not in acute distress.    Appearance: Normal appearance. He is normal weight. He is not ill-appearing, toxic-appearing or diaphoretic.  HENT:     Head: Normocephalic and atraumatic.     Nose: Nose normal.  Eyes:     General:        Right eye: No discharge.        Left eye: No discharge.     Conjunctiva/sclera: Conjunctivae normal.  Neck:      Musculoskeletal: Normal range of motion and neck supple.  Musculoskeletal:        General: Tenderness and signs of injury present.     Comments: Examination of the lumbar spine shows no obvious deformity.  No ecchymosis or erythema.  There is no breaks in the skin.  There is flattening of the lordotic curve.  His pelvis is level in stance.  Able to forward flex very minimally to his hands at the level of his mid thigh.  Returning to upright posture is very difficult.  Lateral flexion is also blunted visible palpable muscle spasm present in the lower segments.  Tenderness is over the spinous processes of the lower segments from L3-L5.  Patient is able to toe and heel walk normally.  Sensation of the lower extremities is intact to light touch.  She will peroneal and anterior tibialis are strong to clinical testing.  Right leg raise testing in the seated position is positive at 90 degrees on the right with low back pain and mild radiation into his posterior right thigh.  His cross positive pain on the left localized to the low back.  Range of motion is full and comfortable to internal/external rotation.  He has no noticeable limp during the toe and heel walking exam.  Skin:    General: Skin is warm and dry.  Neurological:     General: No focal deficit present.     Mental Status: He is alert and oriented to person, place, and time.  Psychiatric:        Mood and Affect: Mood normal.        Behavior: Behavior normal.        Thought Content: Thought content normal.        Judgment: Judgment normal.      UC Treatments / Results  Labs (all labs ordered are listed, but only abnormal results are displayed) Labs Reviewed - No data to display  EKG None  Radiology Dg Lumbar Spine Complete  Result Date: 07/26/2018 CLINICAL DATA:  Right-sided low back pain radiating into the right leg after fall 2-3 days ago. EXAM: LUMBAR SPINE - COMPLETE 4+ VIEW COMPARISON:  CT abdomen pelvis dated July 27, 2017.  FINDINGS: Five  lumbar type vertebral bodies. No acute fracture or subluxation. Vertebral body heights are preserved. Alignment is normal. Unchanged mild disc height loss at L4-L5. Remaining intervertebral disc spaces are maintained. The sacroiliac joints are unremarkable. IMPRESSION: 1.  No acute osseous abnormality. 2. Unchanged mild degenerative disc disease at L4-L5. Electronically Signed   By: Obie Dredge M.D.   On: 07/26/2018 18:40    Procedures Procedures (including critical care time)  Medications Ordered in UC Medications  ketorolac (TORADOL) injection 60 mg (60 mg Intramuscular Given 07/26/18 1837)    Initial Impression / Assessment and Plan / UC Course  I have reviewed the triage vital signs and the nursing notes.  Pertinent labs & imaging results that were available during my care of the patient were reviewed by me and considered in my medical decision making (see chart for details).   Reviewed the x-rays with the patient.  It is a contusion of the lumbar spine with a lumbosacral strain.  He does have a radicular component.  The x-rays he has narrowing of the L4-5 disc space with corresponding narrowing of the foramina likely contributing to the radiculitis.  Told him that he will should use ice on the area.  He should avoid sitting lifting and bending is much as feasible.  Of course he should ambulate as much as possible to prevent deconditioning.  Start him on Mobic daily as well as Skelaxin with appropriate precautions.  He is in the process of finding a primary care physician and should get into soon as possible for follow-up.  He worsens he should go to the emergency room   Final Clinical Impressions(s) / UC Diagnoses   Final diagnoses:  Strain of lumbar region, initial encounter  Lumbar contusion, initial encounter  Lumbar radiculitis     Discharge Instructions     Apply ice 20 minutes out of every 2 hours 4-5 times daily for comfort.     ED Prescriptions     Medication Sig Dispense Auth. Provider   metaxalone (SKELAXIN) 800 MG tablet Take 1 tablet (800 mg total) by mouth 3 (three) times daily. 21 tablet Ovid Curd P, PA-C   meloxicam (MOBIC) 15 MG tablet Take 1 tablet (15 mg total) by mouth daily. 30 tablet Lutricia Feil, PA-C     Controlled Substance Prescriptions [facility] Controlled Substance Registry consulted? Not Applicable   Lutricia Feil, PA-C 07/26/18 1913

## 2018-08-30 ENCOUNTER — Ambulatory Visit
Admission: EM | Admit: 2018-08-30 | Discharge: 2018-08-30 | Disposition: A | Discharge status: Home / Self Care | Payer: Self-pay | Attending: Family Medicine | Admitting: Family Medicine

## 2018-08-30 ENCOUNTER — Other Ambulatory Visit: Payer: Self-pay

## 2018-08-30 DIAGNOSIS — R05 Cough: Secondary | ICD-10-CM

## 2018-08-30 DIAGNOSIS — R0981 Nasal congestion: Secondary | ICD-10-CM

## 2018-08-30 DIAGNOSIS — J069 Acute upper respiratory infection, unspecified: Secondary | ICD-10-CM

## 2018-08-30 DIAGNOSIS — R197 Diarrhea, unspecified: Secondary | ICD-10-CM

## 2018-08-30 DIAGNOSIS — R112 Nausea with vomiting, unspecified: Secondary | ICD-10-CM

## 2018-08-30 MED ORDER — HYDROCOD POLST-CPM POLST ER 10-8 MG/5ML PO SUER: 5.0000 mL | Freq: Every evening | ORAL | 0 refills | Status: AC | PRN

## 2018-08-30 MED ORDER — PROMETHAZINE HCL 25 MG PO TABS: 25.0000 mg | ORAL_TABLET | Freq: Once | ORAL | Status: DC

## 2018-08-30 NOTE — Discharge Instructions (Addendum)
Take medication as prescribed.  Take home nausea medicine as needed.  Rest. Drink plenty of fluids.  Bland foods.  Follow up with your primary care physician this week as needed. Return to Urgent care for new or worsening concerns.

## 2018-08-30 NOTE — ED Triage Notes (Signed)
Patient complains of cough, nausea and vomiting that started started last night and has persisted today. patient states that he needs a note for work.

## 2018-08-30 NOTE — ED Provider Notes (Signed)
[facility] ____________________________________________  Time seen: Approximately 1:01 PM  I have reviewed the triage vital signs and the nursing notes.   HISTORY  Chief Complaint Emesis  HPI Zachary Conrad is a 28 y.o. male presenting for evaluation of 4 days of runny nose, nasal congestion and some cough, but reports vomiting and diarrhea since last night.  States felt like he was doing okay with the cough and congestion symptoms, except cough was disturbing sleep.  States last night started having numerous episodes of vomiting and some diarrhea.  States has had 3-4 episodes of vomiting this morning but no more diarrhea so far.  States still feel nauseated right now.  Has tolerated some fluids at home.  Denies abdominal pain, intermittent cramping.  States that he does have Zofran at home if needed, but states did not take because he did not feel like he could swallow the pill.  No definite fevers.  States possible fever last night.  Denies others around him with similar, but does report a lot of sick contacts at work.  Denies known food triggers.  Denies any abnormal color stool or vomit or blood in stool or vomit.  Denies recent sickness.   Past Medical History:  Diagnosis Date  . Kidney stones     There are no active problems to display for this patient.   Past Surgical History:  Procedure Laterality Date  . HAND SURGERY Right   . TONSILLECTOMY       No current facility-administered medications for this encounter.   Current Outpatient Medications:  .  cetirizine (ZYRTEC) 10 MG chewable tablet, Chew 10 mg by mouth daily., Disp: , Rfl:  .  ibuprofen (ADVIL,MOTRIN) 600 MG tablet, Take 1 tablet (600 mg total) by mouth every 6 (six) hours as needed., Disp: 30 tablet, Rfl: 0 .  ranitidine (ZANTAC) 150 MG tablet, Take 150 mg by mouth 2 (two) times daily., Disp: , Rfl:  .  chlorpheniramine-HYDROcodone (TUSSIONEX PENNKINETIC ER) 10-8 MG/5ML SUER, Take 5 mLs by  mouth at bedtime as needed for cough. do not drive or operate machinery while taking as can cause drowsiness., Disp: 40 mL, Rfl: 0  Allergies Labetalol; Azithromycin; Bactrim [sulfamethoxazole-trimethoprim]; Ciprofloxacin; Flexeril [cyclobenzaprine]; Keflex [cephalexin]; and Strawberry extract  Family History  Problem Relation Age of Onset  . Bipolar disorder Mother   . Healthy Father     Social History Social History   Tobacco Use  . Smoking status: Never Smoker  . Smokeless tobacco: Current User    Types: Snuff  Substance Use Topics  . Alcohol use: Yes    Frequency: Never    Comment: rarely  . Drug use: No    Review of Systems Constitutional: No known fever.  ENT: as above.  Cardiovascular: Denies chest pain. Respiratory: Denies shortness of breath. Gastrointestinal: No abdominal pain.  Positive nausea, vomiting, diarrhea. Genitourinary: Negative for dysuria. Musculoskeletal: Negative for back pain. Skin: Negative for rash.   ____________________________________________   PHYSICAL EXAM:  VITAL SIGNS: ED Triage Vitals  Enc Vitals Group     BP 08/30/18 1116 (!) 144/88     Pulse Rate 08/30/18 1116 (!) 59     Resp 08/30/18 1116 18     Temp 08/30/18 1116 98.2 F (36.8 C)     Temp Source 08/30/18 1116 Oral     SpO2 08/30/18 1116 100 %     Weight 08/30/18 1114 190 lb (86.2 kg)     Height 08/30/18 1114 5\' 10"  (1.778 m)  Head Circumference --      Peak Flow --      Pain Score 08/30/18 1114 3     Pain Loc --      Pain Edu? --      Excl. in GC? --     Constitutional: Alert and oriented. Well appearing and in no acute distress. Eyes: Conjunctivae are normal.  Head: Atraumatic. No sinus tenderness to palpation. No swelling. No erythema.  Ears: no erythema, normal TMs bilaterally.   Nose:Nasal congestion   Mouth/Throat: Mucous membranes are moist. No pharyngeal erythema. No tonsillar swelling or exudate.  Neck: No stridor.  No cervical spine tenderness to  palpation. Hematological/Lymphatic/Immunilogical: No cervical lymphadenopathy. Cardiovascular: Normal rate, regular rhythm. Grossly normal heart sounds.  Good peripheral circulation. Respiratory: Normal respiratory effort.  No retractions. No wheezes, rales or rhonchi. Good air movement.  Gastrointestinal: Soft and nontender. Normal Bowel sounds. Musculoskeletal: Ambulatory with steady gait. No cervical, thoracic or lumbar tenderness to palpation. Neurologic:  Normal speech and language. No gait instability. Skin:  Skin appears warm, dry and intact. No rash noted. Psychiatric: Mood and affect are normal. Speech and behavior are normal. ___________________________________________   LABS (all labs ordered are listed, but only abnormal results are displayed)  Labs Reviewed - No data to display  PROCEDURES Procedures   INITIAL IMPRESSION / ASSESSMENT AND PLAN / ED COURSE  Pertinent labs & imaging results that were available during my care of the patient were reviewed by me and considered in my medical decision making (see chart for details).  Overall well-appearing patient.  No acute distress.  States that he needs work note due to strict call out policy at work.  Suspect viral illness.  Patient states that he has nausea medicine at home as needed, declines need currently.  Will Rx PRN Tussionex to assist with sleep.  Encourage rest, fluids, supportive care, over-the-counter medication as needed, bland diet.  Work note given for today and tomorrow.Discussed indication, risks and benefits of medications with patient.  Discussed follow up with Primary care physician this week. Discussed follow up and return parameters including no resolution or any worsening concerns. Patient verbalized understanding and agreed to plan.   ____________________________________________   FINAL CLINICAL IMPRESSION(S) / ED DIAGNOSES  Final diagnoses:  Nausea vomiting and diarrhea  Acute upper respiratory  infection     ED Discharge Orders         Ordered    chlorpheniramine-HYDROcodone (TUSSIONEX PENNKINETIC ER) 10-8 MG/5ML SUER  At bedtime PRN     08/30/18 1148           Note: This dictation was prepared with Dragon dictation along with smaller phrase technology. Any transcriptional errors that result from this process are unintentional.         Renford Dills, NP 08/30/18 1631

## 2018-09-17 ENCOUNTER — Emergency Department: Payer: Self-pay

## 2018-09-17 ENCOUNTER — Encounter: Payer: Self-pay | Admitting: Emergency Medicine

## 2018-09-17 ENCOUNTER — Emergency Department
Admission: EM | Admit: 2018-09-17 | Discharge: 2018-09-17 | Disposition: A | Discharge status: Home / Self Care | Payer: Self-pay | Attending: Student in an Organized Health Care Education/Training Program | Admitting: Student in an Organized Health Care Education/Training Program

## 2018-09-17 ENCOUNTER — Other Ambulatory Visit: Payer: Self-pay

## 2018-09-17 DIAGNOSIS — X500XXA Overexertion from strenuous movement or load, initial encounter: Secondary | ICD-10-CM | POA: Insufficient documentation

## 2018-09-17 DIAGNOSIS — M25511 Pain in right shoulder: Secondary | ICD-10-CM | POA: Insufficient documentation

## 2018-09-17 DIAGNOSIS — F17220 Nicotine dependence, chewing tobacco, uncomplicated: Secondary | ICD-10-CM | POA: Insufficient documentation

## 2018-09-17 DIAGNOSIS — Z79899 Other long term (current) drug therapy: Secondary | ICD-10-CM | POA: Insufficient documentation

## 2018-09-17 MED ORDER — IBUPROFEN 400 MG PO TABS
400.0000 mg | ORAL_TABLET | Freq: Once | ORAL | Status: AC | PRN
  Administered 2018-09-17: 400 mg via ORAL
  Filled 2018-09-17: qty 1

## 2018-09-17 MED ORDER — NAPROXEN 500 MG PO TABS
500.0000 mg | ORAL_TABLET | Freq: Once | ORAL | Status: AC
  Administered 2018-09-17: 500 mg via ORAL
  Filled 2018-09-17: qty 1

## 2018-09-17 NOTE — ED Triage Notes (Addendum)
Patient ambulatory to triage with steady gait, without difficulty or distress noted; pt reports needs a work note for right shoulder pain several days; st pain & "popping" with ROM; denies specific injury

## 2018-09-17 NOTE — ED Provider Notes (Signed)
[facility] Emergency Department Provider Note    First MD Initiated Contact with Patient 09/17/18 [phone]     (approximate)  I have reviewed the triage vital signs and the nursing notes.   HISTORY  Chief Complaint No chief complaint on file.    HPI Zachary Conrad is a 28 y.o. male close past medical history presents the ER for evaluation of several days of progressive worsening right shoulder pain.  Patient works at the [employer] doing heavy lifting as well as multiple repetitive motions.  He is right-hand dominant.  States that he feels a "popping "and has significant discomfort and sometimes shooting pain into his fingertips.  Denies any falls or specific trauma.  He was switched duties at work for lighter lifting but more frequent repetitive motions anything that that is making symptoms worse.  Denies any previous injury to his shoulder.  Past Medical History:  Diagnosis Date  . Kidney stones    Family History  Problem Relation Age of Onset  . Bipolar disorder Mother   . Healthy Father    Past Surgical History:  Procedure Laterality Date  . HAND SURGERY Right   . TONSILLECTOMY     There are no active problems to display for this patient.     Prior to Admission medications   Medication Sig Start Date End Date Taking? Authorizing Provider  cetirizine (ZYRTEC) 10 MG chewable tablet Chew 10 mg by mouth daily.    [provider]  chlorpheniramine-HYDROcodone (TUSSIONEX PENNKINETIC ER) 10-8 MG/5ML SUER Take 5 mLs by mouth at bedtime as needed for cough. do not drive or operate machinery while taking as can cause drowsiness. 08/30/18   Renford Dills, NP  ibuprofen (ADVIL,MOTRIN) 600 MG tablet Take 1 tablet (600 mg total) by mouth every 6 (six) hours as needed. 04/29/18   Enid Derry, PA-C  ranitidine (ZANTAC) 150 MG tablet Take 150 mg by mouth 2 (two) times daily.    [provider]    Allergies Labetalol; Azithromycin;  Bactrim [sulfamethoxazole-trimethoprim]; Ciprofloxacin; Flexeril [cyclobenzaprine]; Keflex [cephalexin]; and Strawberry extract    Social History Social History   Tobacco Use  . Smoking status: Never Smoker  . Smokeless tobacco: Current User    Types: Snuff  Substance Use Topics  . Alcohol use: Yes    Frequency: Never    Comment: rarely  . Drug use: No    Review of Systems Patient denies headaches, rhinorrhea, blurry vision, numbness, shortness of breath, chest pain, edema, cough, abdominal pain, nausea, vomiting, diarrhea, dysuria, fevers, rashes or hallucinations unless otherwise stated above in HPI. ____________________________________________   PHYSICAL EXAM:  VITAL SIGNS: Vitals:   09/17/18 0134  BP: 128/79  Pulse: 96  Resp: 18  Temp: 98.2 F (36.8 C)  SpO2: 100%    Constitutional: Alert and oriented. Well appearing and in no acute distress. Eyes: Conjunctivae are normal.  Head: Atraumatic. Nose: No congestion/rhinnorhea. Mouth/Throat: Mucous membranes are moist.   Neck: Painless ROM.  Cardiovascular:   Good peripheral circulation. Respiratory: Normal respiratory effort.  No retractions.  Gastrointestinal: Soft and nontender.  Musculoskeletal: Tenderness to palpation over the right AC joint.  Pain is worsened by internal/external rotation of the right upper extremity.  no lower extremity tenderness .  No joint effusions. Neurologic:  Normal speech and language. No gross focal neurologic deficits are appreciated.  Skin:  Skin is warm, dry and intact. No rash noted. Psychiatric: Mood and affect are normal. Speech and behavior are normal.  ____________________________________________  LABS (all labs ordered are listed, but only abnormal results are displayed)  No results found for this or any previous visit (from the past 24 hour(s)). ____________________________________________ ____________________________________________  RADIOLOGY  I personally  reviewed all radiographic images ordered to evaluate for the above acute complaints and reviewed radiology reports and findings.  These findings were personally discussed with the patient.  Please see medical record for radiology report.  ____________________________________________   PROCEDURES  Procedure(s) performed:  Procedures    Critical Care performed: no ____________________________________________   INITIAL IMPRESSION / ASSESSMENT AND PLAN / ED COURSE  Pertinent labs & imaging results that were available during my care of the patient were reviewed by me and considered in my medical decision making (see chart for details).  DDX: fracture, dislocation, contusion, ligamentous injury  Zachary Conrad is a 28 y.o. who presents to the ED with right shoulder pain as described above.  No focal injury.  Patient with x-ray showing no evidence of fracture but possible AC joint injury..  No history of injury to suggest or explain the mechanism for [facility] type injury.  Exam more concerning for rotator cuff injury.  Patient stable and appropriate for outpatient follow-up and referral to orthopedics.  Patient placed in sling.      ____________________________________________   FINAL CLINICAL IMPRESSION(S) / ED DIAGNOSES  Final diagnoses:  Acute pain of right shoulder      NEW MEDICATIONS STARTED DURING THIS VISIT:  New Prescriptions   No medications on file     Note:  This document was prepared using Dragon voice recognition software and may include unintentional dictation errors.     Willy Eddy, MD 09/17/18 [phone]

## 2019-08-15 IMAGING — CR RIGHT SHOULDER - 2+ VIEW
3 series · 3 of 3 positions shown · non-contrast
Comparison: None.

CLINICAL DATA: Right shoulder pain for several days. Pain and
popping during range of motion. No specific injury.

EXAM:
RIGHT SHOULDER - 2+ VIEW

[shoulder grashey]
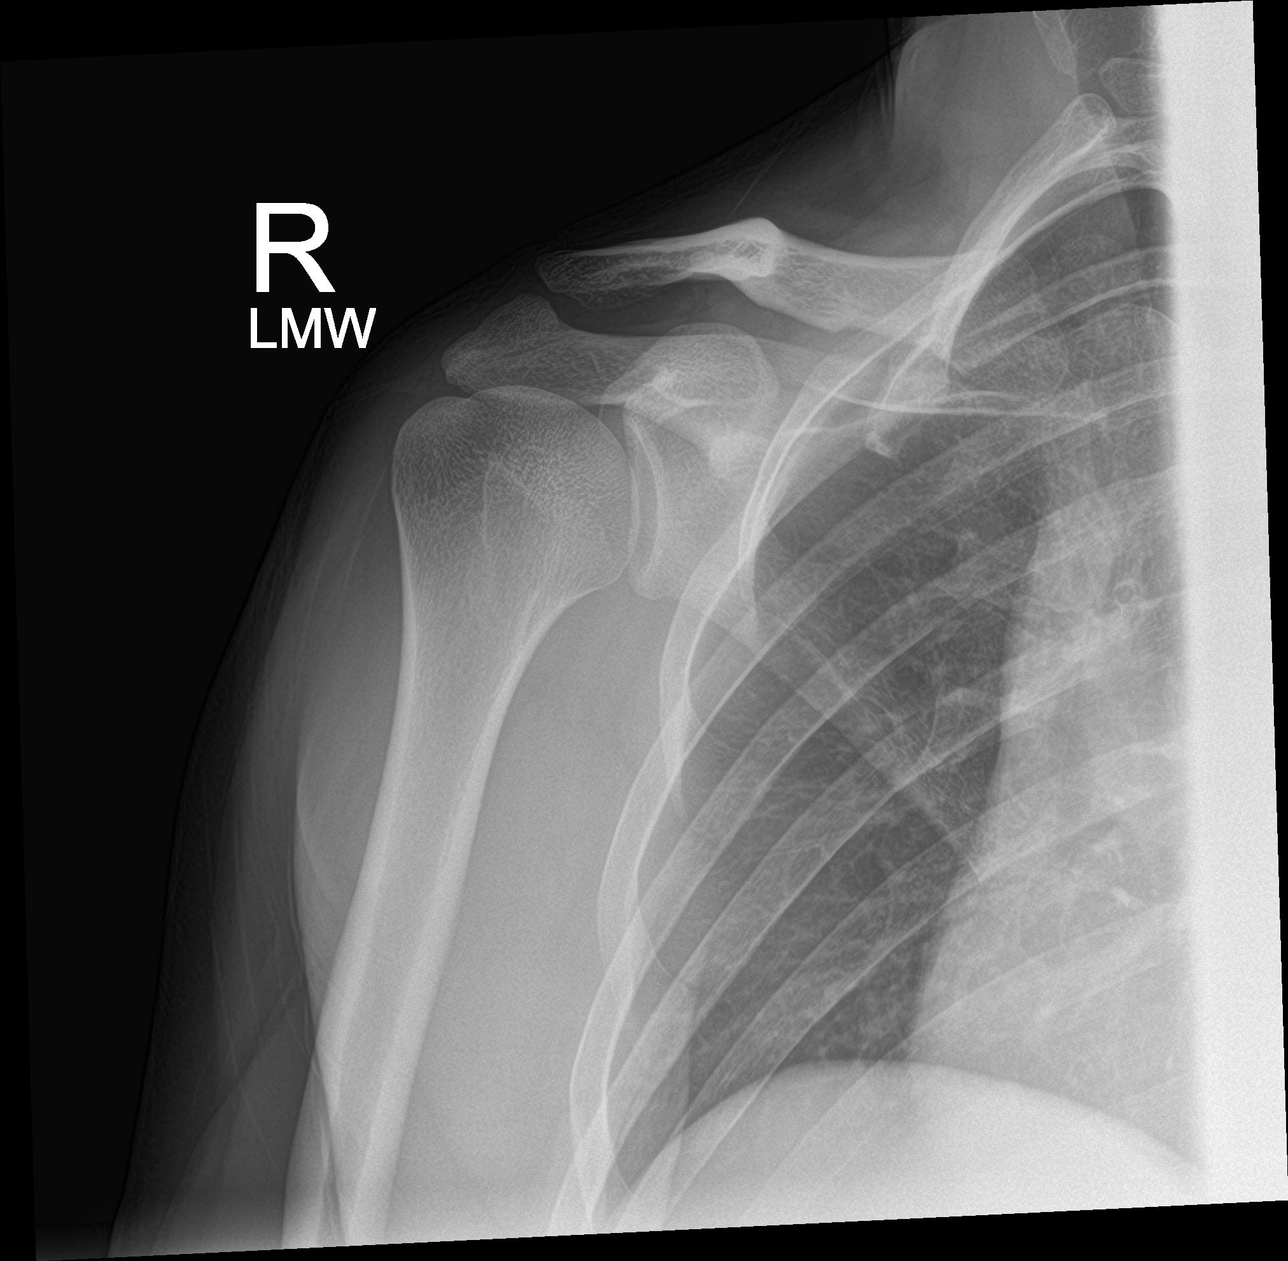

[shoulder y view]
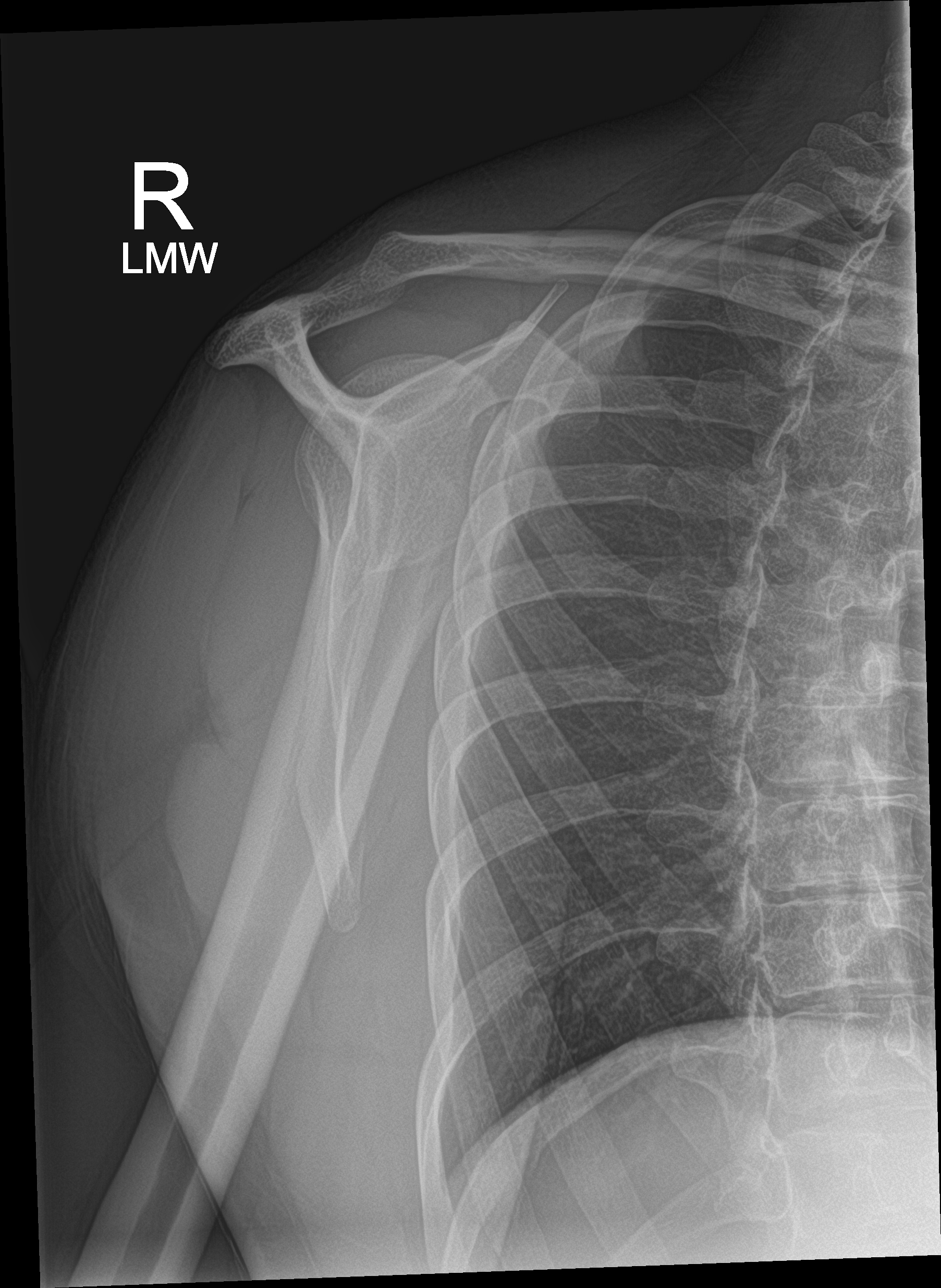

[shoulder axillary]
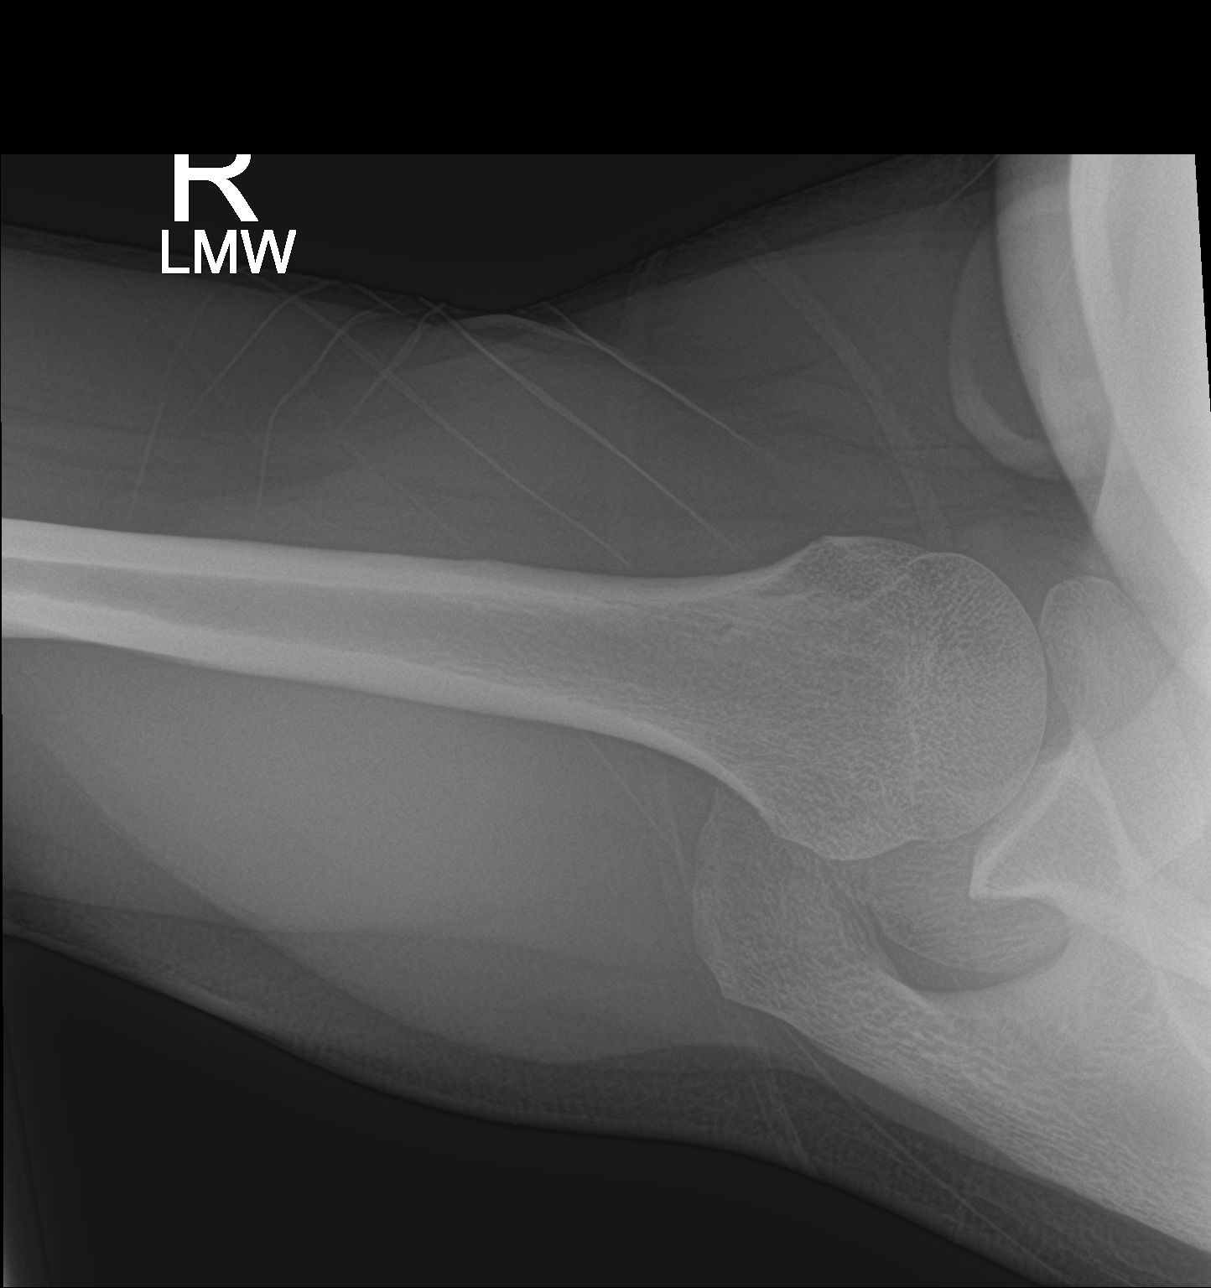

[3 of 3 positions shown; findings below may reference images not displayed]

FINDINGS: There is abnormal alignment of the acromioclavicular junction which
could indicate ligamentous injury. Consider further evaluation with
weighted views. No evidence of acute fracture or glenohumeral
dislocation. Soft tissues are unremarkable.
IMPRESSION: Abnormal alignment of the acromioclavicular junction which could
indicate ligamentous injury. No acute fractures.

## 2020-07-07 IMAGING — US US RENAL
1 series · 14 of 25 positions shown · non-contrast
Comparison: CT abdomen and pelvis 07/27/2017.

CLINICAL DATA: Right flank pain for 1 week.  Microscopic hematuria.

EXAM:
RENAL / URINARY TRACT ULTRASOUND COMPLETE

[Series 1: us renal · 0.23mm/px · 14 of 32 slices shown]
[im 1/32]
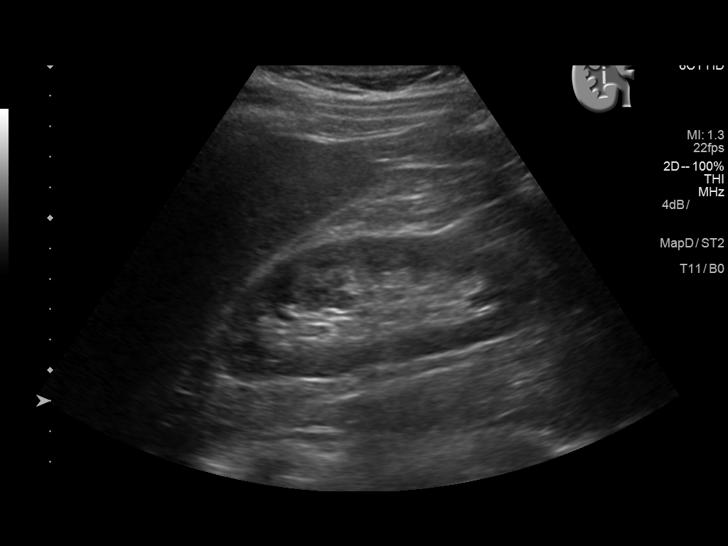
[im 3/32]
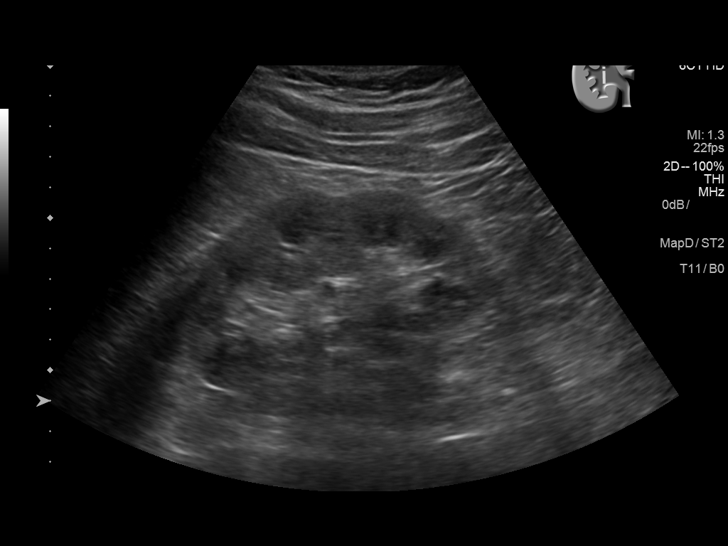
[im 6/32]
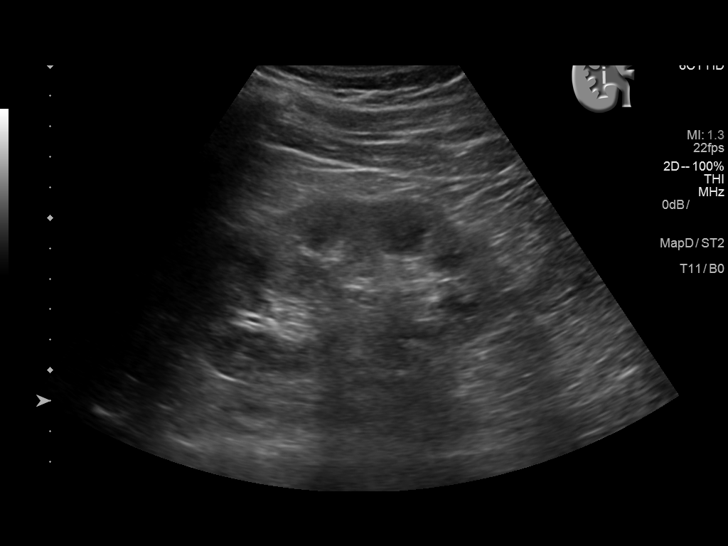
[im 8/32]
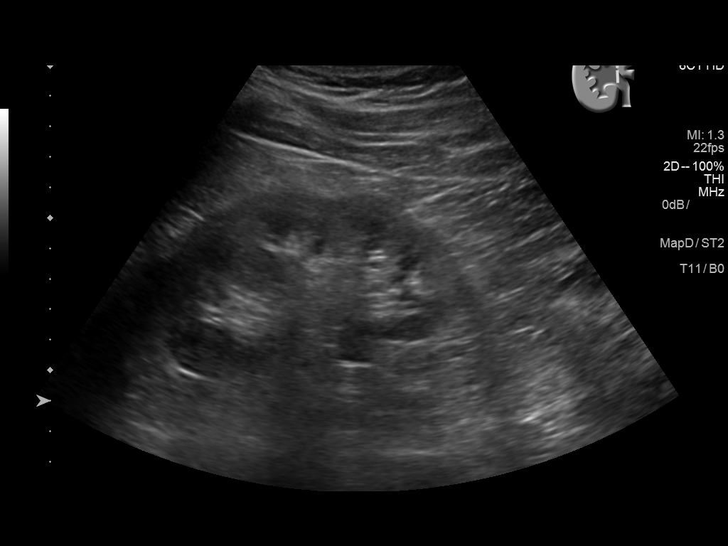
[im 11/32]
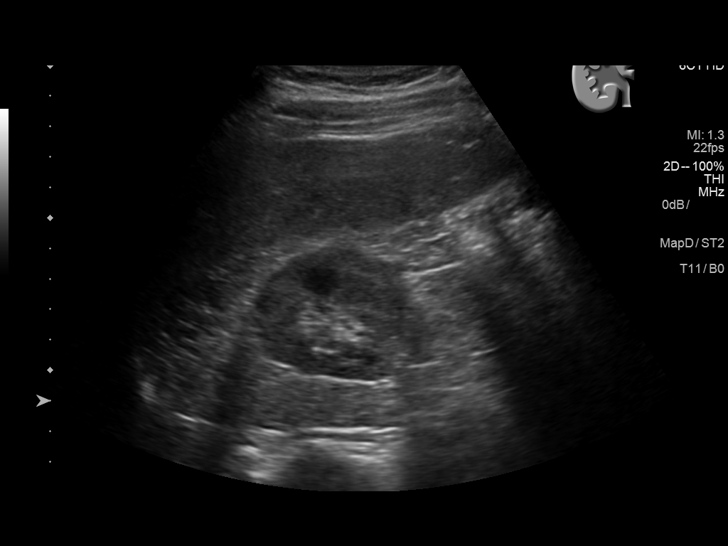
[im 12/32]
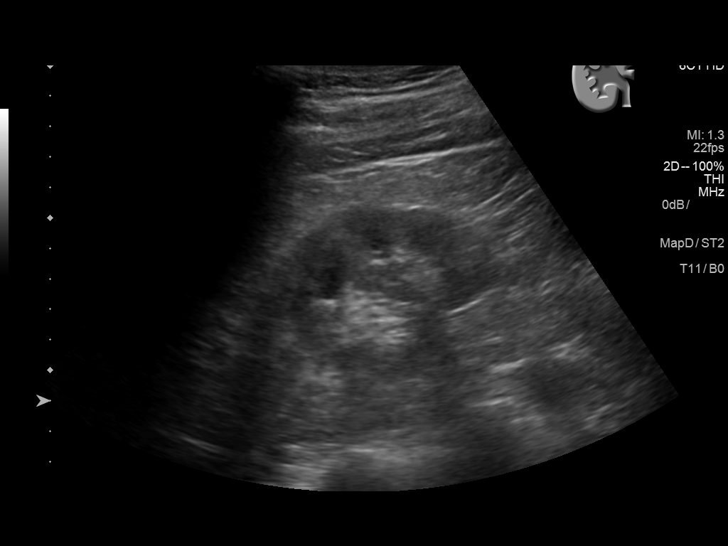
[im 15/32]
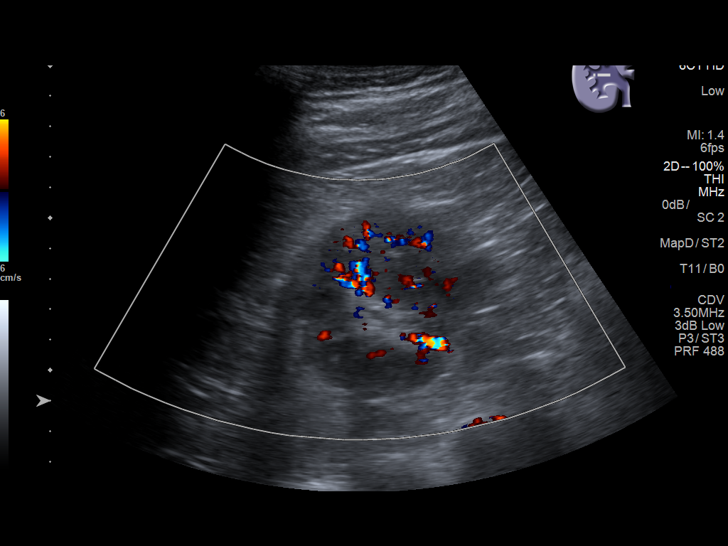
[im 17/32]
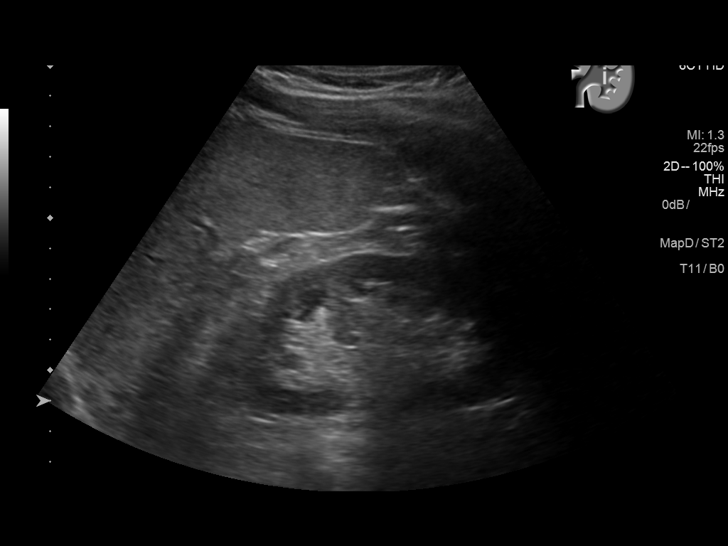
[im 20/32]
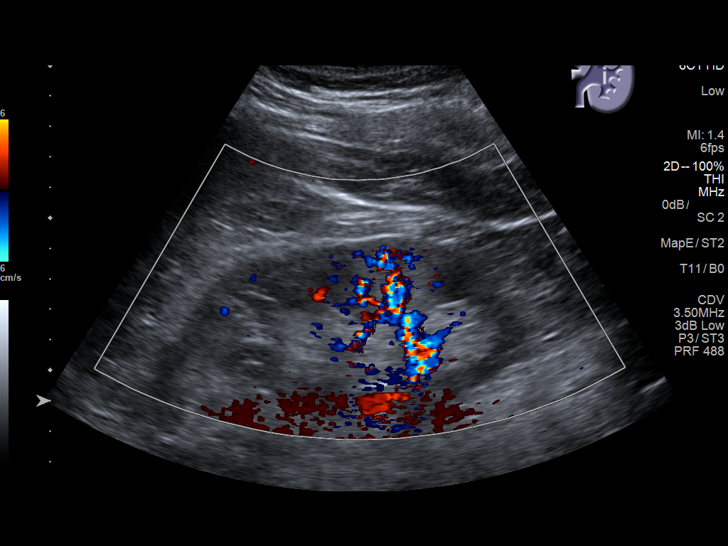
[im 21/32]
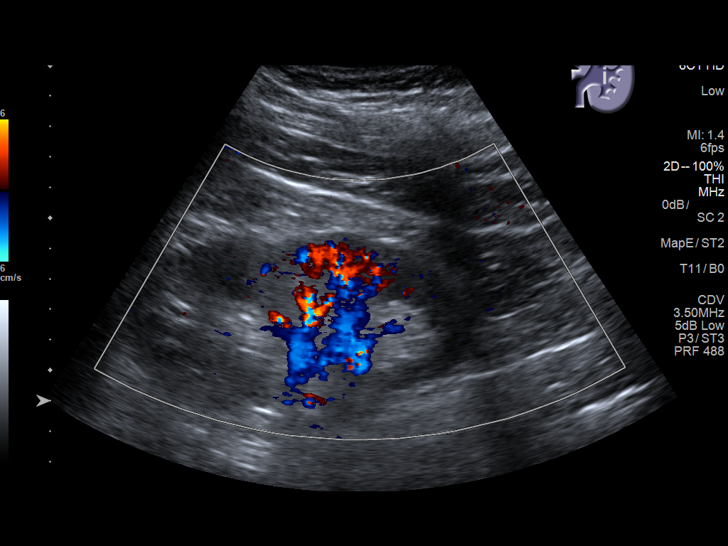
[im 24/32]
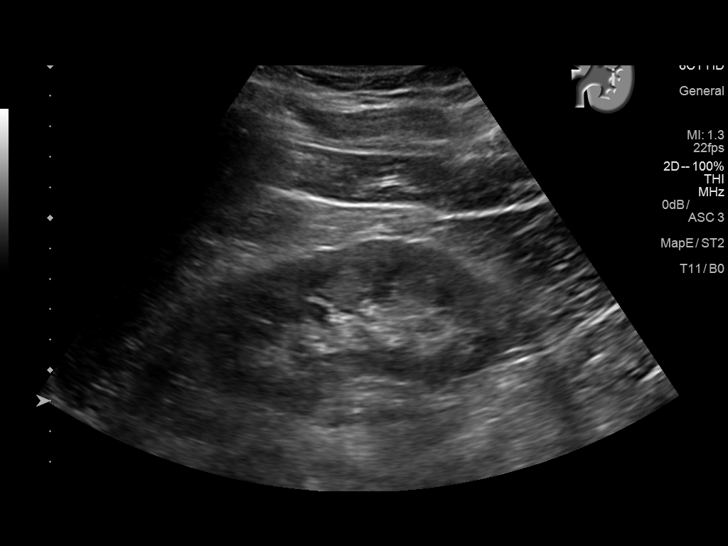
[im 26/32]
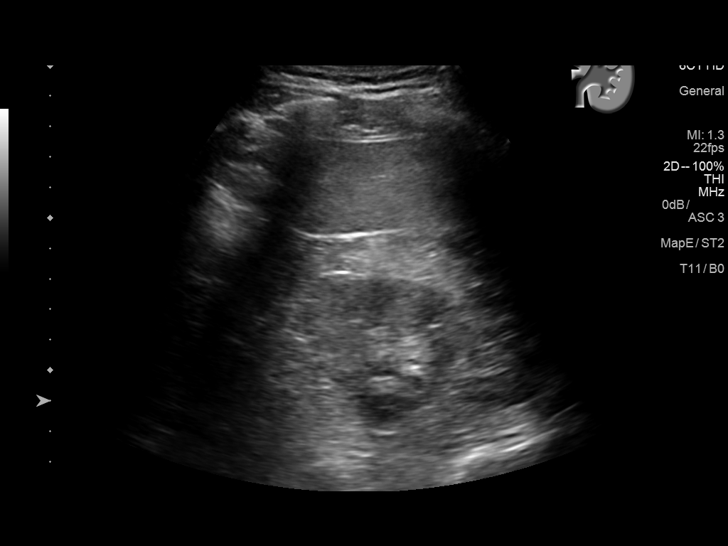
[im 29/32]
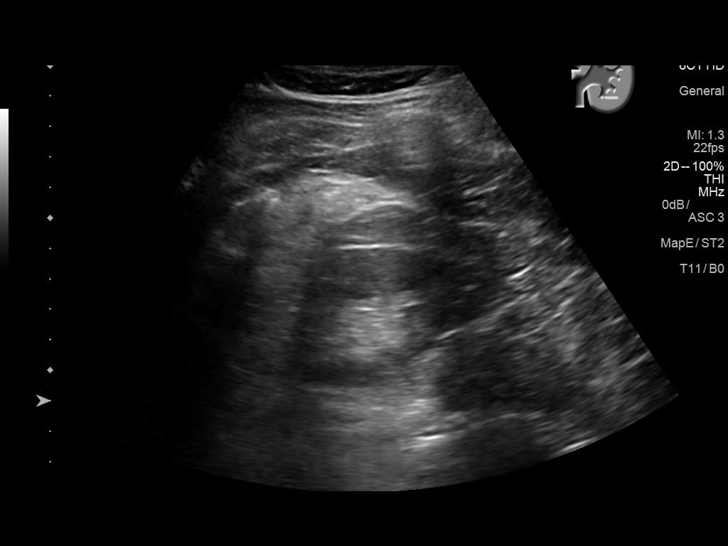
[im 32/32]
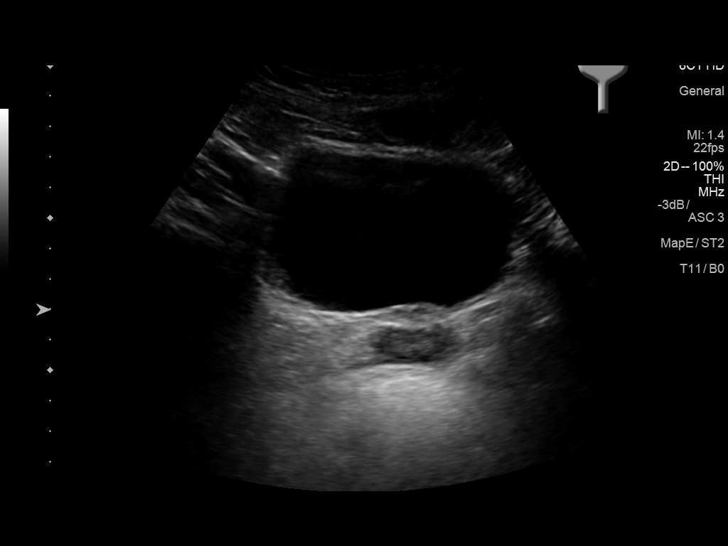

[14 of 25 positions shown; findings below may reference images not displayed]

FINDINGS: Right Kidney:

Length: 10.6 cm. Echogenicity within normal limits. No mass or
hydronephrosis visualized.

Left Kidney:

Length: 11.9 cm. Echogenicity within normal limits. No mass or
hydronephrosis visualized.

Bladder:

Appears normal for degree of bladder distention.
IMPRESSION: Negative for hydronephrosis.  Negative exam.
